# Patient Record
Sex: Male | Born: 1935 | Race: White | Hispanic: No | Smoking: Former smoker
Health system: Southern US, Community
[De-identification: ages and names within clinical notes are randomized; demographics above are authoritative.]

## PROBLEM LIST (undated history)

## (undated) DIAGNOSIS — K219 Gastro-esophageal reflux disease without esophagitis: Secondary | ICD-10-CM

## (undated) DIAGNOSIS — F1010 Alcohol abuse, uncomplicated: Secondary | ICD-10-CM

## (undated) DIAGNOSIS — I4891 Unspecified atrial fibrillation: Secondary | ICD-10-CM

## (undated) DIAGNOSIS — Z8719 Personal history of other diseases of the digestive system: Secondary | ICD-10-CM

## (undated) DIAGNOSIS — I10 Essential (primary) hypertension: Secondary | ICD-10-CM

## (undated) DIAGNOSIS — F0390 Unspecified dementia without behavioral disturbance: Secondary | ICD-10-CM

## (undated) DIAGNOSIS — I509 Heart failure, unspecified: Secondary | ICD-10-CM

## (undated) HISTORY — DX: Alcohol abuse, uncomplicated: F10.10

## (undated) HISTORY — DX: Essential (primary) hypertension: I10

## (undated) HISTORY — DX: Unspecified atrial fibrillation: I48.91

## (undated) HISTORY — PX: TONSILLECTOMY: SUR1361

## (undated) HISTORY — DX: Heart failure, unspecified: I50.9

## (undated) HISTORY — PX: EYE SURGERY: SHX253

---

## 2015-09-13 ENCOUNTER — Emergency Department (HOSPITAL_COMMUNITY): Payer: Medicare Other

## 2015-09-13 ENCOUNTER — Inpatient Hospital Stay (HOSPITAL_COMMUNITY)
Admission: EM | Admit: 2015-09-13 | Discharge: 2015-09-20 | DRG: 871 | Disposition: A | Discharge status: Skilled Nursing Facility | Payer: Medicare Other | Attending: Internal Medicine | Admitting: Internal Medicine

## 2015-09-13 ENCOUNTER — Encounter (HOSPITAL_COMMUNITY): Payer: Self-pay

## 2015-09-13 DIAGNOSIS — Z833 Family history of diabetes mellitus: Secondary | ICD-10-CM | POA: Diagnosis not present

## 2015-09-13 DIAGNOSIS — I480 Paroxysmal atrial fibrillation: Secondary | ICD-10-CM | POA: Diagnosis present

## 2015-09-13 DIAGNOSIS — F10239 Alcohol dependence with withdrawal, unspecified: Secondary | ICD-10-CM | POA: Insufficient documentation

## 2015-09-13 DIAGNOSIS — I420 Dilated cardiomyopathy: Secondary | ICD-10-CM | POA: Diagnosis not present

## 2015-09-13 DIAGNOSIS — I428 Other cardiomyopathies: Secondary | ICD-10-CM | POA: Diagnosis present

## 2015-09-13 DIAGNOSIS — I471 Supraventricular tachycardia: Secondary | ICD-10-CM | POA: Diagnosis present

## 2015-09-13 DIAGNOSIS — J9601 Acute respiratory failure with hypoxia: Secondary | ICD-10-CM | POA: Diagnosis present

## 2015-09-13 DIAGNOSIS — F1020 Alcohol dependence, uncomplicated: Secondary | ICD-10-CM | POA: Diagnosis present

## 2015-09-13 DIAGNOSIS — N183 Chronic kidney disease, stage 3 (moderate): Secondary | ICD-10-CM | POA: Diagnosis present

## 2015-09-13 DIAGNOSIS — G92 Toxic encephalopathy: Secondary | ICD-10-CM | POA: Diagnosis present

## 2015-09-13 DIAGNOSIS — I5031 Acute diastolic (congestive) heart failure: Secondary | ICD-10-CM | POA: Diagnosis not present

## 2015-09-13 DIAGNOSIS — I493 Ventricular premature depolarization: Secondary | ICD-10-CM | POA: Diagnosis present

## 2015-09-13 DIAGNOSIS — Z6824 Body mass index (BMI) 24.0-24.9, adult: Secondary | ICD-10-CM | POA: Diagnosis not present

## 2015-09-13 DIAGNOSIS — I214 Non-ST elevation (NSTEMI) myocardial infarction: Secondary | ICD-10-CM | POA: Diagnosis present

## 2015-09-13 DIAGNOSIS — Z808 Family history of malignant neoplasm of other organs or systems: Secondary | ICD-10-CM

## 2015-09-13 DIAGNOSIS — R6520 Severe sepsis without septic shock: Secondary | ICD-10-CM | POA: Diagnosis present

## 2015-09-13 DIAGNOSIS — G9340 Encephalopathy, unspecified: Secondary | ICD-10-CM | POA: Diagnosis not present

## 2015-09-13 DIAGNOSIS — E440 Moderate protein-calorie malnutrition: Secondary | ICD-10-CM | POA: Diagnosis present

## 2015-09-13 DIAGNOSIS — A419 Sepsis, unspecified organism: Secondary | ICD-10-CM | POA: Diagnosis present

## 2015-09-13 DIAGNOSIS — R778 Other specified abnormalities of plasma proteins: Secondary | ICD-10-CM

## 2015-09-13 DIAGNOSIS — R339 Retention of urine, unspecified: Secondary | ICD-10-CM | POA: Diagnosis present

## 2015-09-13 DIAGNOSIS — I5041 Acute combined systolic (congestive) and diastolic (congestive) heart failure: Secondary | ICD-10-CM | POA: Diagnosis present

## 2015-09-13 DIAGNOSIS — R7989 Other specified abnormal findings of blood chemistry: Secondary | ICD-10-CM

## 2015-09-13 DIAGNOSIS — E871 Hypo-osmolality and hyponatremia: Secondary | ICD-10-CM | POA: Diagnosis present

## 2015-09-13 DIAGNOSIS — E872 Acidosis: Secondary | ICD-10-CM | POA: Diagnosis present

## 2015-09-13 DIAGNOSIS — N179 Acute kidney failure, unspecified: Secondary | ICD-10-CM | POA: Diagnosis present

## 2015-09-13 DIAGNOSIS — F10231 Alcohol dependence with withdrawal delirium: Secondary | ICD-10-CM | POA: Diagnosis present

## 2015-09-13 DIAGNOSIS — J189 Pneumonia, unspecified organism: Secondary | ICD-10-CM | POA: Diagnosis present

## 2015-09-13 DIAGNOSIS — I4891 Unspecified atrial fibrillation: Secondary | ICD-10-CM

## 2015-09-13 DIAGNOSIS — F17210 Nicotine dependence, cigarettes, uncomplicated: Secondary | ICD-10-CM | POA: Diagnosis present

## 2015-09-13 DIAGNOSIS — J9602 Acute respiratory failure with hypercapnia: Secondary | ICD-10-CM | POA: Diagnosis present

## 2015-09-13 DIAGNOSIS — R531 Weakness: Secondary | ICD-10-CM | POA: Diagnosis present

## 2015-09-13 DIAGNOSIS — F10939 Alcohol use, unspecified with withdrawal, unspecified: Secondary | ICD-10-CM | POA: Insufficient documentation

## 2015-09-13 LAB — CBC
HEMATOCRIT: 50.3 % (ref 39.0–52.0)
HEMOGLOBIN: 16.7 g/dL (ref 13.0–17.0)
MCH: 30.6 pg (ref 26.0–34.0)
MCHC: 33.2 g/dL (ref 30.0–36.0)
MCV: 92.3 fL (ref 78.0–100.0)
Platelets: 210 10*3/uL (ref 150–400)
RBC: 5.45 MIL/uL (ref 4.22–5.81)
RDW: 14 % (ref 11.5–15.5)
WBC: 13 10*3/uL — AB (ref 4.0–10.5)

## 2015-09-13 LAB — CBC WITH DIFFERENTIAL/PLATELET
Basophils Absolute: 0 10*3/uL (ref 0.0–0.1)
Basophils Relative: 0 %
Eosinophils Absolute: 0 10*3/uL (ref 0.0–0.7)
Eosinophils Relative: 0 %
HCT: 49.6 % (ref 39.0–52.0)
Hemoglobin: 17.1 g/dL — ABNORMAL HIGH (ref 13.0–17.0)
Lymphocytes Relative: 6 %
Lymphs Abs: 0.8 10*3/uL (ref 0.7–4.0)
MCH: 31.4 pg (ref 26.0–34.0)
MCHC: 34.5 g/dL (ref 30.0–36.0)
MCV: 91 fL (ref 78.0–100.0)
Monocytes Absolute: 1.3 10*3/uL — ABNORMAL HIGH (ref 0.1–1.0)
Monocytes Relative: 10 %
Neutro Abs: 10.9 10*3/uL — ABNORMAL HIGH (ref 1.7–7.7)
Neutrophils Relative %: 84 %
Platelets: 237 10*3/uL (ref 150–400)
RBC: 5.45 MIL/uL (ref 4.22–5.81)
RDW: 13.9 % (ref 11.5–15.5)
WBC: 13.1 10*3/uL — ABNORMAL HIGH (ref 4.0–10.5)

## 2015-09-13 LAB — URINE MICROSCOPIC-ADD ON

## 2015-09-13 LAB — BASIC METABOLIC PANEL
Anion gap: 10 (ref 5–15)
BUN: 25 mg/dL — ABNORMAL HIGH (ref 6–20)
CO2: 26 mmol/L (ref 22–32)
Calcium: 9.2 mg/dL (ref 8.9–10.3)
Chloride: 95 mmol/L — ABNORMAL LOW (ref 101–111)
Creatinine, Ser: 1.3 mg/dL — ABNORMAL HIGH (ref 0.61–1.24)
GFR calc Af Amer: 59 mL/min — ABNORMAL LOW (ref >60)
GFR calc non Af Amer: 51 mL/min — ABNORMAL LOW (ref >60)
Glucose, Bld: 142 mg/dL — ABNORMAL HIGH (ref 65–99)
Potassium: 4.8 mmol/L (ref 3.5–5.1)
Sodium: 131 mmol/L — ABNORMAL LOW (ref 135–145)

## 2015-09-13 LAB — URINALYSIS, ROUTINE W REFLEX MICROSCOPIC
Glucose, UA: NEGATIVE mg/dL
Ketones, ur: 15 mg/dL — AB
Leukocytes, UA: NEGATIVE
Nitrite: NEGATIVE
Protein, ur: 100 mg/dL — AB
Specific Gravity, Urine: 1.021 (ref 1.005–1.030)
Urobilinogen, UA: 1 mg/dL (ref 0.0–1.0)
pH: 6 (ref 5.0–8.0)

## 2015-09-13 LAB — MRSA PCR SCREENING: MRSA by PCR: NEGATIVE

## 2015-09-13 LAB — LACTIC ACID, PLASMA
Lactic Acid, Venous: 1 mmol/L (ref 0.5–2.0)
Lactic Acid, Venous: 1.1 mmol/L (ref 0.5–2.0)

## 2015-09-13 LAB — BRAIN NATRIURETIC PEPTIDE: B Natriuretic Peptide: 1302.1 pg/mL — ABNORMAL HIGH (ref 0.0–100.0)

## 2015-09-13 LAB — CREATININE, SERUM
Creatinine, Ser: 1.13 mg/dL (ref 0.61–1.24)
GFR calc non Af Amer: >60 mL/min (ref >60)

## 2015-09-13 LAB — TROPONIN I
TROPONIN I: 0.16 ng/mL — AB (ref <0.031)
Troponin I: 0.12 ng/mL — ABNORMAL HIGH (ref <0.031)

## 2015-09-13 LAB — TSH: TSH: 0.964 u[IU]/mL (ref 0.350–4.500)

## 2015-09-13 LAB — MAGNESIUM: Magnesium: 2 mg/dL (ref 1.7–2.4)

## 2015-09-13 MED ORDER — CHLORHEXIDINE GLUCONATE 0.12 % MT SOLN
15.0000 mL | Freq: Two times a day (BID) | OROMUCOSAL | Status: DC
  Administered 2015-09-14 – 2015-09-15 (×4): 15 mL via OROMUCOSAL
  Filled 2015-09-13 (×2): qty 15

## 2015-09-13 MED ORDER — DEXTROSE 5 % IV SOLN
500.0000 mg | Freq: Once | INTRAVENOUS | Status: AC
  Administered 2015-09-13: 500 mg via INTRAVENOUS
  Filled 2015-09-13: qty 500

## 2015-09-13 MED ORDER — FUROSEMIDE 10 MG/ML IJ SOLN
40.0000 mg | Freq: Once | INTRAMUSCULAR | Status: AC
  Administered 2015-09-13: 40 mg via INTRAVENOUS
  Filled 2015-09-13: qty 4

## 2015-09-13 MED ORDER — DEXTROSE 5 % IV SOLN
500.0000 mg | INTRAVENOUS | Status: DC
  Administered 2015-09-14 – 2015-09-17 (×4): 500 mg via INTRAVENOUS
  Filled 2015-09-13 (×4): qty 500

## 2015-09-13 MED ORDER — ENOXAPARIN SODIUM 40 MG/0.4ML ~~LOC~~ SOLN
40.0000 mg | SUBCUTANEOUS | Status: DC
  Administered 2015-09-13: 40 mg via SUBCUTANEOUS
  Filled 2015-09-13: qty 0.4

## 2015-09-13 MED ORDER — ALBUTEROL SULFATE (2.5 MG/3ML) 0.083% IN NEBU: 2.5000 mg | INHALATION_SOLUTION | RESPIRATORY_TRACT | Status: DC | PRN

## 2015-09-13 MED ORDER — ASPIRIN 81 MG PO CHEW
324.0000 mg | CHEWABLE_TABLET | Freq: Once | ORAL | Status: AC
  Administered 2015-09-13: 324 mg via ORAL
  Filled 2015-09-13: qty 4

## 2015-09-13 MED ORDER — CLOTRIMAZOLE 1 % EX CREA
TOPICAL_CREAM | Freq: Two times a day (BID) | CUTANEOUS | Status: DC
  Administered 2015-09-13: 1 via TOPICAL
  Administered 2015-09-14 – 2015-09-15 (×3): via TOPICAL
  Administered 2015-09-16: 1 via TOPICAL
  Administered 2015-09-16 – 2015-09-20 (×7): via TOPICAL
  Filled 2015-09-13 (×2): qty 15

## 2015-09-13 MED ORDER — NOREPINEPHRINE BITARTRATE 1 MG/ML IV SOLN
5.0000 ug/min | INTRAVENOUS | Status: DC
  Filled 2015-09-13: qty 4

## 2015-09-13 MED ORDER — CETYLPYRIDINIUM CHLORIDE 0.05 % MT LIQD
7.0000 mL | Freq: Two times a day (BID) | OROMUCOSAL | Status: DC
  Administered 2015-09-14 – 2015-09-16 (×5): 7 mL via OROMUCOSAL

## 2015-09-13 MED ORDER — DEXTROSE 5 % IV SOLN
1.0000 g | Freq: Once | INTRAVENOUS | Status: AC
  Administered 2015-09-13: 1 g via INTRAVENOUS
  Filled 2015-09-13: qty 10

## 2015-09-13 MED ORDER — IPRATROPIUM-ALBUTEROL 0.5-2.5 (3) MG/3ML IN SOLN
3.0000 mL | Freq: Once | RESPIRATORY_TRACT | Status: AC
Start: 2015-09-13 — End: 2015-09-13
  Administered 2015-09-13: 3 mL via RESPIRATORY_TRACT
  Filled 2015-09-13: qty 3

## 2015-09-13 MED ORDER — SODIUM CHLORIDE 0.9 % IJ SOLN
3.0000 mL | Freq: Two times a day (BID) | INTRAMUSCULAR | Status: DC
  Administered 2015-09-13 – 2015-09-20 (×12): 3 mL via INTRAVENOUS

## 2015-09-13 MED ORDER — IPRATROPIUM-ALBUTEROL 0.5-2.5 (3) MG/3ML IN SOLN
3.0000 mL | Freq: Three times a day (TID) | RESPIRATORY_TRACT | Status: DC
  Administered 2015-09-14 – 2015-09-16 (×8): 3 mL via RESPIRATORY_TRACT
  Filled 2015-09-13 (×10): qty 3

## 2015-09-13 MED ORDER — SODIUM CHLORIDE 0.9 % IV BOLUS (SEPSIS): 1000.0000 mL | Freq: Once | INTRAVENOUS | Status: DC

## 2015-09-13 MED ORDER — DEXTROSE 5 % IV SOLN
1.0000 g | INTRAVENOUS | Status: DC
  Administered 2015-09-14 – 2015-09-18 (×5): 1 g via INTRAVENOUS
  Filled 2015-09-13 (×5): qty 10

## 2015-09-13 NOTE — ED Provider Notes (Signed)
CSN: [id number]     Arrival date & time 09/13/15  1612 History   First MD Initiated Contact with Patient 09/13/15 1615     Chief Complaint  Patient presents with  . Aphasia  . Weakness     (Consider location/radiation/quality/duration/timing/severity/associated sxs/prior Treatment) HPI   79yM brought in by neighbor for evaluation of decreased mental status. Pt can tell me he doesn't feel well but vague on specifics. Neighbor knocked on door last night to check on him but pt didn't answer. Neighbor went inside and pt said he was too tired to trying getting out of bed. Neighbor called him earlier today and speech seemed slurred and convinced pt to be evaluated.  Pt lives alone. Neighbor last saw pt his normal self a couple days ago. Pt unable to tell me exactly when began feeling poorly. Endorses SOB. Denies any pain anywhere. No fever or chills. No cough. Denies any significant PMHx but does not have routine medical care. Smoker. Denies any trauma or ingestion.   History reviewed. No pertinent past medical history. Past Surgical History  Procedure Laterality Date  . Tonsillectomy     History reviewed. No pertinent family history. Social History  Substance Use Topics  . Smoking status: Current Every Day Smoker  . Smokeless tobacco: None  . Alcohol Use: Yes     Comment: daily    Review of Systems  Level 5 caveat because of encephalopathy.   Allergies  Review of patient's allergies indicates no known allergies.  Home Medications   Prior to Admission medications   Medication Sig Start Date End Date Taking? Authorizing Provider  Ascorbic Acid (VITAMIN C PO) Take by mouth daily.   Yes Historical Provider, MD  Cholecalciferol (VITAMIN D PO) Take by mouth daily.   Yes Historical Provider, MD  OVER THE COUNTER MEDICATION daily. "Vitamin B-12."   Yes Historical Provider, MD  VITAMIN E PO Take by mouth daily.   Yes Historical Provider, MD   BP 154/95 mmHg  Pulse 115  Temp(Src) 98.6  F (37 C) (Oral)  Resp 26  SpO2 91% Physical Exam  Constitutional: He appears well-developed and well-nourished. No distress.  HENT:  Head: Normocephalic and atraumatic.  Eyes: Conjunctivae are normal. Right eye exhibits no discharge. Left eye exhibits no discharge.  Neck: Neck supple.  Cardiovascular: Regular rhythm and normal heart sounds.  Exam reveals no gallop and no friction rub.   No murmur heard. mild tachycardia. irreg irreg.   Pulmonary/Chest: Effort normal and breath sounds normal. No respiratory distress.  Abdominal: Soft. He exhibits no distension. There is no tenderness.  Musculoskeletal: He exhibits edema. He exhibits no tenderness.  Neurological: He is alert.  Laying in bed with eyes open. Speech somewhat slow but understandable. Follows commands. Disoriented to day/month. [employer]. CN 2-12 intact. Mild global weakness. No focal motor deficit.   Skin: Skin is warm and dry.  Nursing note and vitals reviewed.   ED Course  Procedures (including critical care time) Labs Review Labs Reviewed  CBC WITH DIFFERENTIAL/PLATELET - Abnormal; Notable for the following:    WBC 13.1 (*)    Hemoglobin 17.1 (*)    Neutro Abs 10.9 (*)    Monocytes Absolute 1.3 (*)    All other components within normal limits  BASIC METABOLIC PANEL - Abnormal; Notable for the following:    Sodium 131 (*)    Chloride 95 (*)    Glucose, Bld 142 (*)    BUN 25 (*)    Creatinine,  Ser 1.30 (*)    GFR calc non Af Amer 51 (*)    GFR calc Af Amer 59 (*)    All other components within normal limits  TROPONIN I - Abnormal; Notable for the following:    Troponin I 0.12 (*)    All other components within normal limits  BRAIN NATRIURETIC PEPTIDE - Abnormal; Notable for the following:    B Natriuretic Peptide 1302.1 (*)    All other components within normal limits  BLOOD GAS, ARTERIAL - Abnormal; Notable for the following:    pH, Arterial 7.290 (*)    pCO2 arterial 60.7 (*)     Bicarbonate 28.3 (*)    All other components within normal limits  TSH  MAGNESIUM  URINALYSIS, ROUTINE W REFLEX MICROSCOPIC (NOT AT [facility])    Imaging Review Ct Head Wo Contrast  09/13/2015   CLINICAL DATA:  Slurred speech.  EXAM: CT HEAD WITHOUT CONTRAST  TECHNIQUE: Contiguous axial images were obtained from the base of the skull through the vertex without intravenous contrast.  COMPARISON:  None.  FINDINGS: The brainstem, cerebellum, cerebral peduncles, thalamus, basal ganglia, basilar cisterns, and ventricular system appear within normal limits. Periventricular white matter and corona radiata hypodensities favor chronic ischemic microvascular white matter disease. No intracranial hemorrhage, mass lesion, or acute CVA.  Chronic ethmoid and left maxillary sinusitis.  IMPRESSION: 1. No acute intracranial findings. 2. Periventricular white matter and corona radiata hypodensities favor chronic ischemic microvascular white matter disease. 3. Chronic ethmoid and left maxillary sinusitis.   Electronically Signed   By: Gaylyn Rong M.D.   On: 09/13/2015 17:12   Dg Chest Portable 1 View  09/13/2015   CLINICAL DATA:  Dyspnea.  Fatigue.  Altered speech.  EXAM: PORTABLE CHEST 1 VIEW  COMPARISON:  None.  FINDINGS: Midline trachea. Normal heart size for level of inspiration. Probable small left pleural effusion. No pneumothorax. Patchy left base opacities, obscuring the left hemidiaphragm.  IMPRESSION: Suspicion of left lower lobe airspace disease/ pneumonia with small left pleural effusion. Consider PA and lateral radiographs for further evaluation.   Electronically Signed   By: Jeronimo Greaves M.D.   On: 09/13/2015 17:21   I have personally reviewed and evaluated these images and lab results as part of my medical decision-making.   EKG Interpretation   Date/Time:  Monday September 13 2015 16:32:07 EDT Ventricular Rate:  111 PR Interval:    QRS Duration: 161 QT Interval:  373 QTC Calculation: 507 R  Axis:   107 Text Interpretation:  Atrial fibrillation Anteroseptal infarct, old Repol  abnrm suggests ischemia, diffuse leads No old tracing to compare Confirmed  by Maitland Muhlbauer  MD, Williams Dietrick ([id number]) on 09/13/2015 6:40:28 PM      MDM   Final diagnoses:  Encephalopathy acute  CAP (community acquired pneumonia)  Atrial fibrillation, unspecified  Elevated troponin    79yM brought in by neighbor because of decreased mental status.   Seems more like generalized weakness/drowsiness than acute neurological event. CT head w/o acute abnormality. Probably multifactorial. Mental status improved when placed on supplemental o2.  Wheezing on exam. Long standing smoking hx. May have some degree of underlying lung disease. Bicarb with pCO2 in 60s would support this. Wheezing improving with nebs.  CXR w/ LLL infiltrate. Abx for possible CAP. Noted to be in afib. Rate around 100. Mild hypertension. Rate controlling/BP meds deffered at this time. Ischemic changes on EKG and mildly elevated troponin. ASA given. He has continually denied CP through ED stay. Heparin deferred. Discussed  with cards. Suspect may be more demand ischemia. Will see in consultation. Will discuss with medicine.     Raeford Razor, MD 09/22/15 [phone]

## 2015-09-13 NOTE — ED Notes (Signed)
Unable to collect labs at this time MD in room with patient. 

## 2015-09-13 NOTE — ED Notes (Signed)
Per friend, came to pt home last night and unable to get him out of bed.  Called him this morning and speech was not clear.  Pt is fatigued and weak.  Speech is slightly off.  Equal grips and strength.  No unilateral changes noted.  Dr. Juleen China assessed pt in lobby

## 2015-09-13 NOTE — ED Notes (Signed)
Patient gave permission for staff to discuss care with his ex-wife (number in chart). She is the only one who lives locally and came to check on him on behalf of the children. Son is said to be the POA but lives in [place].

## 2015-09-13 NOTE — Progress Notes (Deleted)
eLink Physician-Brief Progress Note Patient Name: Mark Mullen DOB: 1935-08-14 MRN: [id number]   Date of Service  09/13/2015  HPI/Events of Note  Septic shock  And lactic acid high and not f/u   eICU Interventions  Start septic shock protocol  Full pccm note to follow      Intervention Category Major Interventions: Shock - evaluation and management;Sepsis - evaluation and management  Shan Levans 09/13/2015, 10:07 PM

## 2015-09-13 NOTE — ED Notes (Signed)
Patient transported to X-ray 

## 2015-09-13 NOTE — Progress Notes (Signed)
Elink entered in error.  i took septic shock orders out  My prior note is in error.  Mark Mullen

## 2015-09-13 NOTE — ED Notes (Signed)
Pt had periods of apnea where saturations with drop to 81% and pt was hard to arouse. RN made aware, and pt moved to Resus room per MD order

## 2015-09-13 NOTE — ED Notes (Signed)
Unable to collect labs at this time patient is not in room. 

## 2015-09-13 NOTE — H&P (Signed)
History and Physical  Mark Mullen  ZOX:[id number]  DOB: [birth year]  DOA: 09/13/2015  Referring physician: Raeford Razor, MD PCP: None  Chief Complaint: Found at home, sluggish and hypoxic.  HPI: Mark Mullen is a 80 y.o. male with no known past medical history who presents with 1 week malaise, weakness.  History is collected from the patient's ex-wife, Mark Mullen (see demographics) as the patient is unable to provide reliable history.  Evidently he is somewhat reclusive, but in the last week family have been unable to reach him by phone, and yesterday sent a neighbor to check on him.  He was appeared tired and sluggish, but sent the neighbor away.  Today, there was enough concern that EMS were called and found the patient confused, lethargic and hypoxic.    In the ED, the patient was in atrial fibrillation with adequate BP.  He appeared fluid overloaded and had a LLL infiltrate.  He was acidotic to 7.29 with hypercarbia and required >6L O2 to maintain adequate oxygen saturation.  He was given bronchodilators, furosemide, and started on CTX and azithromycin for CAP and sepsis and was admitted to [facility] in the step-down unit.     Review of Systems:  Patient seen 8:25 PM on 09/13/2015. Pt denies all review of systems.  He denies dyspnea, but is clearly working hard to breathe.  He denies weakness or fatigue, but cannot sit up.   History reviewed. No pertinent past medical history. The above past medical history was reviewed.  His wife reports he has macular degeneration and has never been diagnosed with heart disease, stroke, lung disease, kidney disease, hypertension or diabetes, however, he has had no medical care for decades.  Past Surgical History  Procedure Laterality Date  . Tonsillectomy    The above surgical history was reviewed.  Social History:  reports that he has been smoking.  He does not have any smokeless tobacco history on file. He reports that he drinks alcohol. He reports  that he does not use illicit drugs. Patient lives alone in [place].  His HCPOA is his son, Mark Mullen.  [name] is present in [place].  She reports that he is gay, that he is a current every day smoker, and that he drinks alcohol regularly.  No Known Allergies  Family History  Problem Relation Age of Onset  . Brain cancer Mother   . Aneurysm Father   . Diabetes Sister   . Diabetes Brother       Prior to Admission medications   Medication Sig Start Date End Date Taking? Authorizing Provider  Ascorbic Acid (VITAMIN C PO) Take by mouth daily.   Yes Historical Provider, MD  Cholecalciferol (VITAMIN D PO) Take by mouth daily.   Yes Historical Provider, MD  OVER THE COUNTER MEDICATION daily. "Vitamin B-12."   Yes Historical Provider, MD  VITAMIN E PO Take by mouth daily.   Yes Historical Provider, MD    Physical Exam: BP 139/98 mmHg  Pulse 85  Temp(Src) 98.6 F (37 C) (Oral)  Resp 22  SpO2 99% General: Elderly, adult male, lethargic, weak, and confused.  On oxymask. HEENT: Head normal.  Corneas clear, conjunctivae and sclerae normal without injection or icterus, lids and lashes normal, scant purulent eye discharge on left.  PERRL.  Nose normal.  OP tacky, without plaques or ulcers.  No airway deformities.  Lymph: No cervical or axillary lymphadenopathy. Skin: Warm.  No jaundice.  No suspicious rashes or lesions.  Numerous SKs all over.  Cardiac: Tachycardic irregularly irregular, no murmurs appreciated.  Capillary refill is less than 2 seconds.  JVP appears normal.  2+ LE edema to knees.  Radial and DP pulses 2+ and symmetric. Respiratory: Tachypneic.  Very coarse rales diffusely, these can even be felt on the chest. Abdomen: BS present.  No TTP or rebound all quadrants.  Soft and without distension or guarding.  No striae, scars, dilated veins, rashes, or lesions.   Neuro: Responds to questions slowly.  Poor eye contact.  Oriented to state and city, but not hospital or  year.  Speech is sluggish.    Muscle bulk somewhat diminished.  Globally weak with 3+/5 grip strength and elbow and wrist flexion/extension, symmetrically and  3+/5 hip flexion symmetrically.  Poorly able to follow commands.               Labs on Admission:  The metabolic panel is notable for hyponatremia, normal potassium, normal bicarbonate, creatinine 1.3 mg/dL with no baseline. Magnesium normal. ABG shows pH 7.29, PCO2 60, bicarbonate 28. The BNP is 13,000 pg/mL. The denies 0.12 ng/mL. TSH is normal. The complete blood count is notable for leukocytosis to 13.1K/ul, normal platelets.     Radiological Exams on Admission: Ct Head Wo Contrast 09/13/2015    NAICD  Dg Chest Portable 1 View Personally reviewed: 09/13/2015 Congested, possible left effusion and ?LLL opacity.   EKG: Independently reviewed. Atrial fibrillation with ST depressions diffusely.    Assessment/Plan Present on Admission:  . Encephalopathy acute . Severe sepsis with acute organ dysfunction . AKI (acute kidney injury) . Atrial fibrillation . NSTEMI (non-ST elevated myocardial infarction) . CAP (community acquired pneumonia) . Severe sepsis   1. Severe sepsis from CAP:  Family report the patient complained of a cold and not feeling well about a week ago, and his chest x-ray and white blood cell count suggests that he does have a community-acquired pneumonia. -Trend lactic acid -Cautious fluid resuscitation, this will have to be balanced with his hypoxia and what appears to be fluid overload on clinical exam. -Follow blood cultures -Consult RT and bronchodilators -Strep pneumo urinary antigen -Ceftriaxone 1 g daily -Azithromycin 250 mg daily  2. Acute encephalopathy: Suspect that this is better explained by #1 above rather than stroke.  3. Acute kidney injury: The patient's baseline creatinine is not known. He is encephalopathic and has evidence of heart failure and I suspect that his abdomen  is a function of acute kidney failure and chronic kidney disease. -Hold nephrotoxins -Trend BMP  4. New onset atrial fibrillation: This was discussed with cardiology by the ED. His heart rate is controlled at the moment and hopefully this will resolve as the patient's underlying sepsis is treated and his volume status is optimized.  5. NSTEMI: Suspect dyspnea and ischemia in the setting of atrial fibrillation. -Trend troponin  6. Hyponatremia: This appears to be a hypervolemic hyponatremia. -Trend BMP  7. Hypercarbia: I suspect some of the patient's acidosis is related to this hypercarbia and some is related to lactic acidosis. -BiPAP -Repeat ABG at midnight     DVT PPx: Lovenox Diet: Regular Code Status: Full Family Communication: The patient's care was discussed with his ex-wife Darel Hong.  His diagnosis of pneumonia and severe sepsis with organ dysfunction was discussed, his code status was clarified with the patient who did seem to both of [place] to desire invasive ventilation if needed.  All questions were answered.  I called the patient's son Josephanthony Tindel, who did not answer, but await  a call back.   Disposition Plan:  The appropriate admission status for this patient is INPATIENT. Inpatient status is judged to be reasonable and necessary in order to provide the required intensity of service to ensure the patient's safety. The patient's presenting symptoms, physical exam findings, and initial radiographic and laboratory data in the context of their chronic comorbidities is felt to place them at high risk for further clinical deterioration. Furthermore, it is not anticipated that the patient will be medically stable for discharge from the hospital within 2 midnights of admission. The following factors support the admission status of inpatient.   A. The patient's presenting symptoms include encephalopathy from sepsis, lethargy. B. The worrisome physical exam findings include tachycardia,  tachypnea, rales, weakness. C. The initial radiographic and laboratory data are worrisome because of acidosis, renal failure, leukocytosis, chest infiltrate. D. The chronic co-morbidities include smoking. E. Patient requires inpatient status due to high intensity of service, high risk for further deterioration and high frequency of surveillance required. F. I certify that at the point of admission it is my clinical judgment that the patient will require inpatient hospital care spanning beyond 2 midnights from the point of admission.     Alberteen Sam [facility] Pager [phone]

## 2015-09-13 NOTE — ED Notes (Signed)
MD with patient

## 2015-09-13 NOTE — ED Notes (Signed)
Respiratory called to collect ABG 

## 2015-09-13 NOTE — Progress Notes (Signed)
Patient noted as not having a pcp or insurance.  EDCM went to speak to patient at bedside, however patient having portable xray at this time.  No family present.  EDRN reports patient is confused.

## 2015-09-14 ENCOUNTER — Inpatient Hospital Stay (HOSPITAL_COMMUNITY): Payer: Medicare Other

## 2015-09-14 ENCOUNTER — Encounter (HOSPITAL_COMMUNITY): Payer: Self-pay | Admitting: Cardiology

## 2015-09-14 DIAGNOSIS — N179 Acute kidney failure, unspecified: Secondary | ICD-10-CM

## 2015-09-14 DIAGNOSIS — A419 Sepsis, unspecified organism: Principal | ICD-10-CM

## 2015-09-14 DIAGNOSIS — I4891 Unspecified atrial fibrillation: Secondary | ICD-10-CM

## 2015-09-14 DIAGNOSIS — E440 Moderate protein-calorie malnutrition: Secondary | ICD-10-CM

## 2015-09-14 DIAGNOSIS — I480 Paroxysmal atrial fibrillation: Secondary | ICD-10-CM

## 2015-09-14 DIAGNOSIS — I420 Dilated cardiomyopathy: Secondary | ICD-10-CM

## 2015-09-14 DIAGNOSIS — R6520 Severe sepsis without septic shock: Secondary | ICD-10-CM

## 2015-09-14 LAB — BLOOD GAS, ARTERIAL
ACID-BASE EXCESS: 2.2 mmol/L — AB (ref 0.0–2.0)
Acid-Base Excess: 0.2 mmol/L (ref 0.0–2.0)
Acid-Base Excess: 3.3 mmol/L — ABNORMAL HIGH (ref 0.0–2.0)
Bicarbonate: 28.1 mEq/L — ABNORMAL HIGH (ref 20.0–24.0)
Bicarbonate: 28.3 mEq/L — ABNORMAL HIGH (ref 20.0–24.0)
Bicarbonate: 29.9 mEq/L — ABNORMAL HIGH (ref 20.0–24.0)
DRAWN BY: 257701
DRAWN BY: 422461
Delivery systems: POSITIVE
Delivery systems: POSITIVE
Drawn by: 307971
EXPIRATORY PAP: 6
Expiratory PAP: 6
FIO2: 0.3
FIO2: 0.6
Inspiratory PAP: 14
Inspiratory PAP: 20
MODE: POSITIVE
Mode: POSITIVE
O2 Content: 3.5 L/min
O2 SAT: 96.6 %
O2 SAT: 99 %
O2 Saturation: 96 %
PATIENT TEMPERATURE: 98.6
PATIENT TEMPERATURE: 98.2
PCO2 ART: 45.1 mmHg — AB (ref 35.0–45.0)
PCO2 ART: 59.6 mmHg — AB (ref 35.0–45.0)
PH ART: 7.319 — AB (ref 7.350–7.450)
PO2 ART: 89.3 mmHg (ref 80.0–100.0)
PO2 ART: 205 mmHg — AB (ref 80.0–100.0)
Patient temperature: 98.6
RATE: 10 resp/min
TCO2: 24.1 mmol/L (ref 0–100)
TCO2: 24.6 mmol/L (ref 0–100)
TCO2: 26 mmol/L (ref 0–100)
pCO2 arterial: 60.7 mmHg (ref 35.0–45.0)
pH, Arterial: 7.29 — ABNORMAL LOW (ref 7.350–7.450)
pH, Arterial: 7.411 (ref 7.350–7.450)
pO2, Arterial: 96.8 mmHg (ref 80.0–100.0)

## 2015-09-14 LAB — BASIC METABOLIC PANEL
Anion gap: 8 (ref 5–15)
BUN: 24 mg/dL — AB (ref 6–20)
CHLORIDE: 94 mmol/L — AB (ref 101–111)
CO2: 30 mmol/L (ref 22–32)
CREATININE: 1.27 mg/dL — AB (ref 0.61–1.24)
Calcium: 8.6 mg/dL — ABNORMAL LOW (ref 8.9–10.3)
GFR calc Af Amer: >60 mL/min (ref >60)
GFR calc non Af Amer: 53 mL/min — ABNORMAL LOW (ref >60)
GLUCOSE: 113 mg/dL — AB (ref 65–99)
Potassium: 4.2 mmol/L (ref 3.5–5.1)
SODIUM: 132 mmol/L — AB (ref 135–145)

## 2015-09-14 LAB — TROPONIN I
TROPONIN I: 0.16 ng/mL — AB (ref <0.031)
Troponin I: 0.14 ng/mL — ABNORMAL HIGH (ref <0.031)

## 2015-09-14 LAB — STREP PNEUMONIAE URINARY ANTIGEN: STREP PNEUMO URINARY ANTIGEN: NEGATIVE

## 2015-09-14 LAB — HEPARIN LEVEL (UNFRACTIONATED): HEPARIN UNFRACTIONATED: 0.7 [IU]/mL (ref 0.30–0.70)

## 2015-09-14 LAB — HIV ANTIBODY (ROUTINE TESTING W REFLEX): HIV Screen 4th Generation wRfx: NONREACTIVE

## 2015-09-14 MED ORDER — LORAZEPAM 2 MG/ML IJ SOLN
2.0000 mg | INTRAMUSCULAR | Status: DC | PRN
  Administered 2015-09-14 – 2015-09-15 (×2): 2 mg via INTRAVENOUS
  Administered 2015-09-15: 1 mg via INTRAVENOUS
  Filled 2015-09-14 (×3): qty 1

## 2015-09-14 MED ORDER — HEPARIN BOLUS VIA INFUSION
3500.0000 [IU] | Freq: Once | INTRAVENOUS | Status: AC
  Administered 2015-09-14: 3500 [IU] via INTRAVENOUS
  Filled 2015-09-14: qty 3500

## 2015-09-14 MED ORDER — ASPIRIN 81 MG PO CHEW
81.0000 mg | CHEWABLE_TABLET | Freq: Once | ORAL | Status: AC
  Administered 2015-09-14: 81 mg via ORAL
  Filled 2015-09-14: qty 1

## 2015-09-14 MED ORDER — HEPARIN (PORCINE) IN NACL 100-0.45 UNIT/ML-% IJ SOLN
1000.0000 [IU]/h | INTRAMUSCULAR | Status: DC
  Administered 2015-09-14: 1000 [IU]/h via INTRAVENOUS
  Filled 2015-09-14: qty 250

## 2015-09-14 MED ORDER — FUROSEMIDE 10 MG/ML IJ SOLN
20.0000 mg | Freq: Once | INTRAMUSCULAR | Status: AC
  Administered 2015-09-14: 20 mg via INTRAVENOUS
  Filled 2015-09-14: qty 2

## 2015-09-14 MED ORDER — HEPARIN (PORCINE) IN NACL 100-0.45 UNIT/ML-% IJ SOLN
950.0000 [IU]/h | INTRAMUSCULAR | Status: DC
  Administered 2015-09-15: 950 [IU]/h via INTRAVENOUS
  Filled 2015-09-14 (×3): qty 250

## 2015-09-14 NOTE — Consult Note (Signed)
Reason for Consult: a fib new    Referring Physician: Dr. Loleta Books  PCP:  No primary care provider on file.  Primary Cardiologist:NEW  Mark Mullen is an 80 y.o. male.    Chief Complaint: brought by EMS to hospital 09/12/14 for sluggish, hypoxic at home, had been weak for 1 week.   Son is POA and lives in [place].  [name] provided information last pm.  HPI: 81 year old was admitted by [facility] for hypoxia, sepsis from CAP, acute encephalopathy AKI, new AFib-rate control and elevated troponin though more flat at 0.12,0.16,0.16.  BNP was elevated at 1302.  WBC 13.1.   He was placed on BiPAP.   EKG a fib with rates up to 111, with t Wave inversions in II,III,AVF and V4-6.  No old EKG to compare.   Today no chest pain.  Memory prior to hospitalization poor. Still with BiPAP, Echo being done.  No prior cardiac hx, no CVA.  History reviewed. No pertinent past medical history.  No hx of MI, CAD or CVA   Past Surgical History  Procedure Laterality Date  . Tonsillectomy      Family History  Problem Relation Age of Onset  . Brain cancer Mother   . Aneurysm Father   . Diabetes Sister   . Diabetes Brother    Social History:  reports that he has been smoking.  He does not have any smokeless tobacco history on file. He reports that he drinks alcohol. He reports that he does not use illicit drugs.  OUTPATIENT MEDICATIONS: No current facility-administered medications on file prior to encounter.   No current outpatient prescriptions on file prior to encounter.   CURRENT MEDICATIONS: Scheduled Meds: . antiseptic oral rinse  7 mL Mouth Rinse q12n4p  . azithromycin  500 mg Intravenous Q24H  . cefTRIAXone (ROCEPHIN)  IV  1 g Intravenous Q24H  . chlorhexidine  15 mL Mouth Rinse BID  . clotrimazole   Topical BID  . enoxaparin (LOVENOX) injection  40 mg Subcutaneous Q24H  . ipratropium-albuterol  3 mL Nebulization TID  . sodium chloride  3 mL Intravenous Q12H   Continuous  Infusions:  PRN Meds:.albuterol   Results for orders placed or performed during the hospital encounter of 09/13/15 (from the past 48 hour(s))  CBC with Differential     Status: Abnormal   Collection Time: 09/13/15  4:50 PM  Result Value Ref Range   WBC 13.1 (H) 4.0 - 10.5 K/uL   RBC 5.45 4.22 - 5.81 MIL/uL   Hemoglobin 17.1 (H) 13.0 - 17.0 g/dL   HCT 49.6 39.0 - 52.0 %   MCV 91.0 78.0 - 100.0 fL   MCH 31.4 26.0 - 34.0 pg   MCHC 34.5 30.0 - 36.0 g/dL   RDW 13.9 11.5 - 15.5 %   Platelets 237 150 - 400 K/uL   Neutrophils Relative % 84 %   Neutro Abs 10.9 (H) 1.7 - 7.7 K/uL   Lymphocytes Relative 6 %   Lymphs Abs 0.8 0.7 - 4.0 K/uL   Monocytes Relative 10 %   Monocytes Absolute 1.3 (H) 0.1 - 1.0 K/uL   Eosinophils Relative 0 %   Eosinophils Absolute 0.0 0.0 - 0.7 K/uL   Basophils Relative 0 %   Basophils Absolute 0.0 0.0 - 0.1 K/uL  Basic metabolic panel     Status: Abnormal   Collection Time: 09/13/15  4:50 PM  Result Value Ref Range   Sodium 131 (  L) 135 - 145 mmol/L   Potassium 4.8 3.5 - 5.1 mmol/L   Chloride 95 (L) 101 - 111 mmol/L   CO2 26 22 - 32 mmol/L   Glucose, Bld 142 (H) 65 - 99 mg/dL   BUN 25 (H) 6 - 20 mg/dL   Creatinine, Ser 1.30 (H) 0.61 - 1.24 mg/dL   Calcium 9.2 8.9 - 10.3 mg/dL   GFR calc non Af Amer 51 (L) >60 mL/min   GFR calc Af Amer 59 (L) >60 mL/min    Comment: (NOTE) The eGFR has been calculated using the CKD EPI equation. This calculation has not been validated in all clinical situations. eGFR's persistently <60 mL/min signify possible Chronic Kidney Disease.    Anion gap 10 5 - 15  Troponin I     Status: Abnormal   Collection Time: 09/13/15  4:50 PM  Result Value Ref Range   Troponin I 0.12 (H) <0.031 ng/mL    Comment:        PERSISTENTLY INCREASED TROPONIN VALUES IN THE RANGE OF 0.04-0.49 ng/mL CAN BE SEEN IN:       -UNSTABLE ANGINA       -CONGESTIVE HEART FAILURE       -MYOCARDITIS       -CHEST TRAUMA       -ARRYHTHMIAS       -LATE  PRESENTING MYOCARDIAL INFARCTION       -COPD   CLINICAL FOLLOW-UP RECOMMENDED.   Brain natriuretic peptide     Status: Abnormal   Collection Time: 09/13/15  4:50 PM  Result Value Ref Range   B Natriuretic Peptide 1302.1 (H) 0.0 - 100.0 pg/mL  TSH     Status: None   Collection Time: 09/13/15  4:50 PM  Result Value Ref Range   TSH 0.964 0.350 - 4.500 uIU/mL  Magnesium     Status: None   Collection Time: 09/13/15  4:50 PM  Result Value Ref Range   Magnesium 2.0 1.7 - 2.4 mg/dL  Blood gas, arterial ([facility] & [facility] ONLY)     Status: Abnormal   Collection Time: 09/13/15  6:35 PM  Result Value Ref Range   O2 Content 3.5 L/min   Delivery systems NASAL CANNULA    pH, Arterial 7.290 (L) 7.350 - 7.450   pCO2 arterial 60.7 (HH) 35.0 - 45.0 mmHg    Comment: CRITICAL RESULT CALLED TO, READ BACK BY AND VERIFIED WITH: DR Edwinna Areola AT 1840 BY SHERRY REEKES,RRT,RCP, ON 09/13/2015    pO2, Arterial 96.8 80.0 - 100.0 mmHg   Bicarbonate 28.3 (H) 20.0 - 24.0 mEq/L   TCO2 24.6 0 - 100 mmol/L   Acid-Base Excess 0.2 0.0 - 2.0 mmol/L   O2 Saturation 96.0 %   Patient temperature 98.6    Collection site LEFT BRACHIAL    Drawn by [id number]    Sample type ARTERIAL DRAW   Urinalysis, Routine w reflex microscopic (not at [facility])     Status: Abnormal   Collection Time: 09/13/15  7:07 PM  Result Value Ref Range   Color, Urine AMBER (A) YELLOW    Comment: BIOCHEMICALS MAY BE AFFECTED BY COLOR   APPearance CLEAR CLEAR   Specific Gravity, Urine 1.021 1.005 - 1.030   pH 6.0 5.0 - 8.0   Glucose, UA NEGATIVE NEGATIVE mg/dL   Hgb urine dipstick TRACE (A) NEGATIVE   Bilirubin Urine SMALL (A) NEGATIVE   Ketones, ur 15 (A) NEGATIVE mg/dL   Protein, ur 100 (A) NEGATIVE mg/dL   Urobilinogen, UA 1.0  0.0 - 1.0 mg/dL   Nitrite NEGATIVE NEGATIVE   Leukocytes, UA NEGATIVE NEGATIVE  Urine microscopic-add on     Status: None   Collection Time: 09/13/15  7:07 PM  Result Value Ref Range   RBC / HPF 0-2 <3 RBC/hpf  Lactic acid,  plasma     Status: None   Collection Time: 09/13/15  8:12 PM  Result Value Ref Range   Lactic Acid, Venous 1.0 0.5 - 2.0 mmol/L  MRSA PCR Screening     Status: None   Collection Time: 09/13/15  9:08 PM  Result Value Ref Range   MRSA by PCR NEGATIVE NEGATIVE    Comment:        The GeneXpert MRSA Assay (FDA approved for NASAL specimens only), is one component of a comprehensive MRSA colonization surveillance program. It is not intended to diagnose MRSA infection nor to guide or monitor treatment for MRSA infections.   CBC     Status: Abnormal   Collection Time: 09/13/15  9:26 PM  Result Value Ref Range   WBC 13.0 (H) 4.0 - 10.5 K/uL   RBC 5.45 4.22 - 5.81 MIL/uL   Hemoglobin 16.7 13.0 - 17.0 g/dL   HCT 50.3 39.0 - 52.0 %   MCV 92.3 78.0 - 100.0 fL   MCH 30.6 26.0 - 34.0 pg   MCHC 33.2 30.0 - 36.0 g/dL   RDW 14.0 11.5 - 15.5 %   Platelets 210 150 - 400 K/uL  Creatinine, serum     Status: None   Collection Time: 09/13/15  9:26 PM  Result Value Ref Range   Creatinine, Ser 1.13 0.61 - 1.24 mg/dL   GFR calc non Af Amer >60 >60 mL/min   GFR calc Af Amer >60 >60 mL/min    Comment: (NOTE) The eGFR has been calculated using the CKD EPI equation. This calculation has not been validated in all clinical situations. eGFR's persistently <60 mL/min signify possible Chronic Kidney Disease.   Troponin I (q 6hr x 3)     Status: Abnormal   Collection Time: 09/13/15  9:26 PM  Result Value Ref Range   Troponin I 0.16 (H) <0.031 ng/mL    Comment:        PERSISTENTLY INCREASED TROPONIN VALUES IN THE RANGE OF 0.04-0.49 ng/mL CAN BE SEEN IN:       -UNSTABLE ANGINA       -CONGESTIVE HEART FAILURE       -MYOCARDITIS       -CHEST TRAUMA       -ARRYHTHMIAS       -LATE PRESENTING MYOCARDIAL INFARCTION       -COPD   CLINICAL FOLLOW-UP RECOMMENDED.   Lactic acid, plasma     Status: None   Collection Time: 09/13/15  9:26 PM  Result Value Ref Range   Lactic Acid, Venous 1.1 0.5 - 2.0  mmol/L  Strep pneumoniae urinary antigen     Status: None   Collection Time: 09/14/15 12:00 AM  Result Value Ref Range   Strep Pneumo Urinary Antigen NEGATIVE NEGATIVE    Comment:        Infection due to S. pneumoniae cannot be absolutely ruled out since the antigen present may be below the detection limit of the test.   Blood gas, arterial     Status: Abnormal   Collection Time: 09/14/15 12:13 AM  Result Value Ref Range   FIO2 0.60    Delivery systems BILEVEL POSITIVE AIRWAY PRESSURE    Mode BILEVEL POSITIVE  AIRWAY PRESSURE    LHR 10 resp/min    Comment: BACK UP RATE   Inspiratory PAP 20.0    Expiratory PAP 6.0    pH, Arterial 7.319 (L) 7.350 - 7.450   pCO2 arterial 59.6 (HH) 35.0 - 45.0 mmHg    Comment: CRITICAL RESULT CALLED TO, READ BACK BY AND VERIFIED WITH: Carson Myrtle, RN AT 2706 ON 09/14/15 BY D. JONES, RRT, RCP    pO2, Arterial 205 (H) 80.0 - 100.0 mmHg   Bicarbonate 29.9 (H) 20.0 - 24.0 mEq/L   TCO2 26.0 0 - 100 mmol/L   Acid-Base Excess 2.2 (H) 0.0 - 2.0 mmol/L   O2 Saturation 99.0 %   Patient temperature 98.2    Collection site LEFT RADIAL    Drawn by [id number]    Sample type ARTERIAL DRAW    Allens test (pass/fail) PASS PASS  Troponin I (q 6hr x 3)     Status: Abnormal   Collection Time: 09/14/15  3:02 AM  Result Value Ref Range   Troponin I 0.16 (H) <0.031 ng/mL    Comment:        PERSISTENTLY INCREASED TROPONIN VALUES IN THE RANGE OF 0.04-0.49 ng/mL CAN BE SEEN IN:       -UNSTABLE ANGINA       -CONGESTIVE HEART FAILURE       -MYOCARDITIS       -CHEST TRAUMA       -ARRYHTHMIAS       -LATE PRESENTING MYOCARDIAL INFARCTION       -COPD   CLINICAL FOLLOW-UP RECOMMENDED.   Basic metabolic panel     Status: Abnormal   Collection Time: 09/14/15  3:55 AM  Result Value Ref Range   Sodium 132 (L) 135 - 145 mmol/L   Potassium 4.2 3.5 - 5.1 mmol/L   Chloride 94 (L) 101 - 111 mmol/L   CO2 30 22 - 32 mmol/L   Glucose, Bld 113 (H) 65 - 99 mg/dL   BUN 24 (H) 6  - 20 mg/dL   Creatinine, Ser 1.27 (H) 0.61 - 1.24 mg/dL   Calcium 8.6 (L) 8.9 - 10.3 mg/dL   GFR calc non Af Amer 53 (L) >60 mL/min   GFR calc Af Amer >60 >60 mL/min    Comment: (NOTE) The eGFR has been calculated using the CKD EPI equation. This calculation has not been validated in all clinical situations. eGFR's persistently <60 mL/min signify possible Chronic Kidney Disease.    Anion gap 8 5 - 15   Ct Head Wo Contrast  09/13/2015   CLINICAL DATA:  Slurred speech.  EXAM: CT HEAD WITHOUT CONTRAST  TECHNIQUE: Contiguous axial images were obtained from the base of the skull through the vertex without intravenous contrast.  COMPARISON:  None.  FINDINGS: The brainstem, cerebellum, cerebral peduncles, thalamus, basal ganglia, basilar cisterns, and ventricular system appear within normal limits. Periventricular white matter and corona radiata hypodensities favor chronic ischemic microvascular white matter disease. No intracranial hemorrhage, mass lesion, or acute CVA.  Chronic ethmoid and left maxillary sinusitis.  IMPRESSION: 1. No acute intracranial findings. 2. Periventricular white matter and corona radiata hypodensities favor chronic ischemic microvascular white matter disease. 3. Chronic ethmoid and left maxillary sinusitis.   Electronically Signed   By: Van Clines M.D.   On: 09/13/2015 17:12   Dg Chest Portable 1 View  09/13/2015   CLINICAL DATA:  Dyspnea.  Fatigue.  Altered speech.  EXAM: PORTABLE CHEST 1 VIEW  COMPARISON:  None.  FINDINGS: Midline trachea.  Normal heart size for level of inspiration. Probable small left pleural effusion. No pneumothorax. Patchy left base opacities, obscuring the left hemidiaphragm.  IMPRESSION: Suspicion of left lower lobe airspace disease/ pneumonia with small left pleural effusion. Consider PA and lateral radiographs for further evaluation.   Electronically Signed   By: Abigail Miyamoto M.D.   On: 09/13/2015 17:21    ROS: as best pt could  remember General:no colds or fevers, no weight changes Skin:no rashes or ulcers HEENT:no blurred vision, no congestion CV:see HPI PUL:see HPI GI:no diarrhea constipation or melena, no indigestion GU:no hematuria, no dysuria MS:no joint pain, no claudication Neuro:no syncope, no lightheadedness Endo:no diabetes, no thyroid disease  No Meds,   Does not see any MDs   Blood pressure 94/47, pulse 73, temperature 98.5 F (36.9 C), temperature source Axillary, resp. rate 15, height 5' 7"  (1.702 m), weight 158 lb 8.2 oz (71.9 kg), SpO2 94 %.  Wt Readings from Last 3 Encounters:  09/13/15 158 lb 8.2 oz (71.9 kg)    PE: General:Pleasant affect, NAD Skin:Warm and dry, brisk capillary refill HEENT:normocephalic, sclera clear, mucus membranes moist Neck:supple, + JVD, no bruits  Heart:S1S2 RRR without murmur, gallup, rub or click Lungs:  without rales +, rhonchi, or wheezes BJY:NWGN, non tender, + BS, do not palpate liver spleen or masses Ext:2+ lower ext edema, 2+ pedal pulses, 2+ radial pulses Neuro:alert and oriented, MAE, follows commands, + facial symmetry  tele:  SR currently with PACs.  Echo:  - Left ventricle: The cavity size was normal. Wall thickness was increased with mild hypertrophy of the posterior wall and moderate thickness of the septal wall. Systolic function was moderately to severely reduced. The estimated ejection fraction was in the range of 30% to 35%. Diffuse hypokinesis. Features are consistent with a pseudonormal left ventricular filling pattern, with concomitant abnormal relaxation and increased filling pressure (grade 2 diastolic dysfunction). Doppler parameters are consistent with high ventricular filling pressure. - Aortic valve: There was moderate regurgitation. - Mitral valve: There was mild regurgitation. - Right ventricle: The cavity size was moderately dilated. Wall thickness was normal. Systolic function was normal. - Pulmonary  arteries: PA peak pressure: 36 mm Hg (S). - Pericardium, extracardiac: There was a moderate-sized left pleural effusion. - Impressions: Challenging to assess wall motion and systolic function due to frequent ectopy.  Assessment/Plan Principal Problem:   Severe sepsis with acute organ dysfunction Active Problems:   Encephalopathy acute   AKI (acute kidney injury)   Atrial fibrillation   NSTEMI (non-ST elevated myocardial infarction)   Acute congestive heart failure   CAP (community acquired pneumonia)   Severe sepsis  1. A fib:  Would add IV heparin for now, SR with bursts of SVT but A fib on admit unsure on length of time.  CHA2DS2VASc 3. Recheck EKG.  We can decide later on DOAC.    2. Elevated troponin more of a flat trend currently, could be due to demand ischemia with acute illness in combination with PAF vs. NSTEMI.    Will need at least lexiscan myoview depending on course.  No chest pain.  Start 81 mg ASA.    3. hypoxia continues on BiPAP per IM  4. Hyponatremia- per IM   5. Sepsis and CAP per IM.  6.  Cardiomyopathy:  Dilated.  EF as above.  Possibly related to acute illness.  Not the primary issue at this point.  He will need medical management.   [place]  Nurse Practitioner Certified [facility] Pager [phone] or after 5pm or weekends call [phone] 09/14/2015, 8:45 AM    History and all data above reviewed.  Patient examined.  I agree with the findings as above.  I was able to talk to the patient who is confused and to his ex wife who provided history.  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  However, he can now longer play secondary to joint problems.  He has had no recent cardiac symptoms.  Denies any chest pain or SOB.  He apparently does drink ETOH to excess per his ex wife.   The patient exam reveals COR:RRR  ,  Lungs: Diffuse wheezing  ,  Abd: Positive bowel sounds, no rebound no guarding, Ext No edema  .  All  available labs, radiology testing, previous records reviewed. Agree with documented assessment and plan. Cardiomyopathy:  Likely not ischemic.  Either related to sepsis or ETOH.  Medical management for now.  Possible Lexiscan Myoview which could be done after discharge.  Atrial fib has resolved.  He will need long term DOAC.  We will follow and begin to titrate beta blocker, ARB once his BP is less labile.    Jeneen Rinks Hochrein  11:45 AM  09/14/2015

## 2015-09-14 NOTE — Progress Notes (Signed)
  Echocardiogram 2D Echocardiogram has been performed.  Mark Mullen 09/14/2015, 9:45 AM

## 2015-09-14 NOTE — Progress Notes (Signed)
PROGRESS NOTE  [name] WUJ:[id number] DOB: July 28, 1935 DOA: 09/13/2015 PCP: No primary care provider on file.  HPI/Recap of past 24 hours: Patient is a 80 year old male with no known past medical history who was brought in after being found to be confused and hypoxic. In emergency room, patient noted to be in atrial fibrillation (new) with signs of volume overload as well as sepsis felt to be secondary to a pneumonia after left lower lobe infiltrate seen on x-ray. ABG noted significant hypercarbia with a pH of 7.29 and patient started on BiPAP and admitted to the stepdown unit along with IV fluids and antibiotics.  By the following morning, patient was back in a normal sinus rhythm. Cardiology was consulted. Patient started waking up and becoming more alert. By midafternoon, 9/27, ABG notes much improvement of hypoxia and patient able to be weaned down to 2 L nasal cannula. He is much more alert and interactive. Echocardiogram noted grade 2 diastolic dysfunction and decreased ejection fraction of 30-35 percent. Patient himself with no complaints. No chest pain and his breathing much more comfortably.  Assessment/Plan: Principal Problem:   Severe sepsis secondary to community-acquired pneumonia causing acute respiratory failure with hypoxia and hypercarbia: Organism unknown. Source pneumonia. Patient meets criteria given tachypnea, organ dysfunction, leukocytosis, tachycardia. Treated with IV antibiotics. Stabilized. Patient improved. Active Problems:   Encephalopathy acute: Toxic metabolic encephalopathy felt to be secondary to sepsis. Patient responding well and looks to be back near baseline   AKI (acute kidney injury): Little change with IV fluids. Suspect he has his baseline of stage I chronic kidney disease.    Atrial fibrillation: Now back in sinus rhythm. Seen by cardiology. Chads 2 vasc score of 3. Currently on IV heparin. Given that mentation is much improved, may benefit from  DOAC.  Elevated troponin: Do not think this is acute coronary syndrome, but may be more demand ischemia from sepsis and/or A. fib and/or CHF    Acute combined systolic and diastolic congestive heart failure: New finding. Daily weights. Likely cause of patient's now elevated blood pressure. Starting Lasix and following strict input/output. Given minimal renal dysfunction, would favor starting ACE inhibitor.    Malnutrition of moderate degree: Patient meets criteria for moderate malnutrition in the context of acute illness and injury. Seen by nutrition. Now that he is able to take by mouth, waiting for follow-up assessment   Code Status: Full code  Family Communication: Message left with ex-wife  Disposition Plan: Continue in stepdown until better euvolemic and more stabilized   Consultants:  Cardiology  Procedures:  Echocardiogram done 9/27: Grade 2 diastolic dysfunction plus EF 30-35 percent. Moderate aortic regurg  Antibiotics:  IV Rocephin 9/26-present  IV Zithromax 9/26-present   Objective: BP 181/69 mmHg  Pulse 76  Temp(Src) 98.7 F (37.1 C) (Axillary)  Resp 16  Ht 5\' 7"  (1.702 m)  Wt 71.9 kg (158 lb 8.2 oz)  BMI 24.82 kg/m2  SpO2 100%  Intake/Output Summary (Last 24 hours) at 09/14/15 1548 Last data filed at 09/14/15 1400  Gross per 24 hour  Intake    160 ml  Output   1050 ml  Net   -890 ml   Filed Weights   09/13/15 2050  Weight: 71.9 kg (158 lb 8.2 oz)    Exam:   General:  Alert and oriented 2, no acute distress  Cardiovascular: Regular rhythm, mild bradycardia, occasional ectopic beat  Respiratory: Decreased breath sounds bibasilar  Abdomen: Soft, nontender, nondistended, positive bowel sounds  Musculoskeletal: 1+  pitting edema from the knees down bilaterally   Data Reviewed: Basic Metabolic Panel:  Recent Labs Lab 09/13/15 1650 09/13/15 2126 09/14/15 0355  NA 131*  --  132*  K 4.8  --  4.2  CL 95*  --  94*  CO2 26  --  30   GLUCOSE 142*  --  113*  BUN 25*  --  24*  CREATININE 1.30* 1.13 1.27*  CALCIUM 9.2  --  8.6*  MG 2.0  --   --    Liver Function Tests: No results for input(s): AST, ALT, ALKPHOS, BILITOT, PROT, ALBUMIN in the last 168 hours. No results for input(s): LIPASE, AMYLASE in the last 168 hours. No results for input(s): AMMONIA in the last 168 hours. CBC:  Recent Labs Lab 09/13/15 1650 09/13/15 2126  WBC 13.1* 13.0*  NEUTROABS 10.9*  --   HGB 17.1* 16.7  HCT 49.6 50.3  MCV 91.0 92.3  PLT 237 210   Cardiac Enzymes:    Recent Labs Lab 09/13/15 1650 09/13/15 2126 09/14/15 0302 09/14/15 0946  TROPONINI 0.12* 0.16* 0.16* 0.14*   BNP (last 3 results)  Recent Labs  09/13/15 1650  BNP 1302.1*    ProBNP (last 3 results) No results for input(s): PROBNP in the last 8760 hours.  CBG: No results for input(s): GLUCAP in the last 168 hours.  Recent Results (from the past 240 hour(s))  Blood culture (routine x 2)     Status: None (Preliminary result)   Collection Time: 09/13/15  8:11 PM  Result Value Ref Range Status   Specimen Description BLOOD LEFT HAND  Final   Special Requests BOTTLES DRAWN AEROBIC AND ANAEROBIC 5CC  Final   Culture   Final    NO GROWTH < 24 HOURS Performed at [facility]    Report Status PENDING  Incomplete  MRSA PCR Screening     Status: None   Collection Time: 09/13/15  9:08 PM  Result Value Ref Range Status   MRSA by PCR NEGATIVE NEGATIVE Final    Comment:        The GeneXpert MRSA Assay (FDA approved for NASAL specimens only), is one component of a comprehensive MRSA colonization surveillance program. It is not intended to diagnose MRSA infection nor to guide or monitor treatment for MRSA infections.      Studies: Ct Head Wo Contrast  09/13/2015   CLINICAL DATA:  Slurred speech.  EXAM: CT HEAD WITHOUT CONTRAST  TECHNIQUE: Contiguous axial images were obtained from the base of the skull through the vertex without intravenous  contrast.  COMPARISON:  None.  FINDINGS: The brainstem, cerebellum, cerebral peduncles, thalamus, basal ganglia, basilar cisterns, and ventricular system appear within normal limits. Periventricular white matter and corona radiata hypodensities favor chronic ischemic microvascular white matter disease. No intracranial hemorrhage, mass lesion, or acute CVA.  Chronic ethmoid and left maxillary sinusitis.  IMPRESSION: 1. No acute intracranial findings. 2. Periventricular white matter and corona radiata hypodensities favor chronic ischemic microvascular white matter disease. 3. Chronic ethmoid and left maxillary sinusitis.   Electronically Signed   By: Gaylyn Rong M.D.   On: 09/13/2015 17:12   Dg Chest Portable 1 View  09/13/2015   CLINICAL DATA:  Dyspnea.  Fatigue.  Altered speech.  EXAM: PORTABLE CHEST 1 VIEW  COMPARISON:  None.  FINDINGS: Midline trachea. Normal heart size for level of inspiration. Probable small left pleural effusion. No pneumothorax. Patchy left base opacities, obscuring the left hemidiaphragm.  IMPRESSION: Suspicion of left lower  lobe airspace disease/ pneumonia with small left pleural effusion. Consider PA and lateral radiographs for further evaluation.   Electronically Signed   By: Jeronimo Greaves M.D.   On: 09/13/2015 17:21    Scheduled Meds: . antiseptic oral rinse  7 mL Mouth Rinse q12n4p  . azithromycin  500 mg Intravenous Q24H  . cefTRIAXone (ROCEPHIN)  IV  1 g Intravenous Q24H  . chlorhexidine  15 mL Mouth Rinse BID  . clotrimazole   Topical BID  . furosemide  20 mg Intravenous Once  . ipratropium-albuterol  3 mL Nebulization TID  . sodium chloride  3 mL Intravenous Q12H    Continuous Infusions: . heparin 1,000 Units/hr (09/14/15 1400)     Time spent: 35 minutes  Hollice Espy  [facility] Pager [phone]. If 7PM-7AM, please contact night-coverage at [email or web address], password [facility] 09/14/2015, 3:48 PM  LOS: 1 day

## 2015-09-14 NOTE — Progress Notes (Signed)
RT removed PT from BiPAP and placed on 2 lpm [place]- Sp02 97%. PT is not in respiratory distress at this time. RN aware.

## 2015-09-14 NOTE — Progress Notes (Signed)
ANTICOAGULATION CONSULT NOTE - Initial Consult  Pharmacy Consult for Heparin Indication: atrial fibrillation  No Known Allergies  Patient Measurements: Height: 5\' 7"  (170.2 cm) Weight: 158 lb 8.2 oz (71.9 kg) IBW/kg (Calculated) : 66.1 Heparin Dosing Weight: 71.9 kg  Vital Signs: Temp: 98.5 F (36.9 C) (09/27 0800) Temp Source: Axillary (09/27 0800) BP: 98/50 mmHg (09/27 0800) Pulse Rate: 77 (09/27 0800)  Labs:  Recent Labs  09/13/15 1650 09/13/15 2126 09/14/15 0302 09/14/15 0355  HGB 17.1* 16.7  --   --   HCT 49.6 50.3  --   --   PLT 237 210  --   --   CREATININE 1.30* 1.13  --  1.27*  TROPONINI 0.12* 0.16* 0.16*  --     Estimated Creatinine Clearance: 45.5 mL/min (by C-G formula based on Cr of 1.27).   Medical History: History reviewed. No pertinent past medical history.    Assessment: Patient is a 80 year old male admitted on 9/26 after being found sluggish and hypoxic at home.  Found to be in AFib with stable BP.  Patient received prophylactic dose of Enoxaparin on 9/26. Pharmacy consulted to start Heparin in the setting of new onset AFib vs ACS.    Of note, Hgb 16.7, platelets 210, elevated troponins and BNP  Goal of Therapy:  Heparin level 0.3-0.7 units/ml Monitor platelets by anticoagulation protocol: Yes   Plan:  Give 3500 units bolus x 1 Start heparin infusion at 1000 units/hr Check anti-Xa level in 8 hours and daily while on heparin Continue to monitor H&H and platelets    Kathlynn Grate 09/14/2015,10:19 AM

## 2015-09-14 NOTE — Progress Notes (Signed)
Initial Nutrition Assessment  DOCUMENTATION CODES:   Non-severe (moderate) malnutrition in context of acute illness/injury  INTERVENTION:  - Will monitor for ability to wean from CPAP and pt to consume PO - Will monitor for need for TF - RD will continue to monitor for needs  NUTRITION DIAGNOSIS:   Inadequate oral intake related to inability to eat as evidenced by other (see comment) (pt on CPAP).  GOAL:   Patient will meet greater than or equal to 90% of their needs  MONITOR:   PO intake, Weight trends, Labs, I & O's  REASON FOR ASSESSMENT:   Malnutrition Screening Tool  ASSESSMENT:   Pt presents with 1 week malaise and weakness. History is collected from the patient's ex-wife, [name] (see demographics) as the patient is unable to provide reliable history. Evidently he is somewhat reclusive, but in the last week family have been unable to reach him by phone, and yesterday sent a neighbor to check on him. He was appeared tired and sluggish, but sent the neighbor away. Today, there was enough concern that EMS were called and found the patient confused, lethargic and hypoxic.   Pt seen for MST. BMI indicates normal weight status. Pt currently on CPAP with Ve: 3.4-16.4 L/min during the 2 minutes RD spent in the room.  Mild to moderate muscle wasting noted to upper body and severe fluid accumulation/edema to BLE. No previous weight hx in the chart.   No family/visitors present in room to provide information about PTA. Anticipate pt likely not eating well over the past at least 1 week due to information in H&P as outlined above.   Not currently able to meet needs. Medications reviewed. Labs reviewed; Na: 132 mmol/L, Cl: 94 mmol/L, BUN/creatinine elevated, Ca: 8.6 mg/dL, GFR: 53.   Diet Order:  Diet regular Room service appropriate?: Yes; Fluid consistency:: Thin  Skin:  Reviewed, no issues  Last BM:  PTA  Height:   Ht Readings from Last 1 Encounters:  09/13/15 5'  7" (1.702 m)    Weight:   Wt Readings from Last 1 Encounters:  09/13/15 158 lb 8.2 oz (71.9 kg)    Ideal Body Weight:  67.27 kg (kg)  BMI:  Body mass index is 24.82 kg/(m^2).  Estimated Nutritional Needs:   Kcal:  1500-1700  Protein:  75-90 grams  Fluid:  1.5-1.7 L/day  EDUCATION NEEDS:   No education needs identified at this time     Trenton Gammon, RD, LDN Inpatient Clinical Dietitian Pager # [phone] After hours/weekend pager # [phone]

## 2015-09-14 NOTE — Progress Notes (Signed)
Informed Dr. Chancy Milroy on evening rounds that Mark Mullen was becoming agitated and confused, heart rate up and BP up at 1800. Face reddened.  Poor historian.He has been placed on CIWA Stepdown protocol.

## 2015-09-14 NOTE — Progress Notes (Addendum)
CRITICAL VALUE ALERT  Critical value received:  CO2 59.6  Date of notification:  09/14/15  Time of notification:  0030  Critical value read back:Yes.    Nurse who received alert:  Effie Berkshire  MD notified (1st page):  K Schorr  Time of first page:  0035  MD notified (2nd page):  Time of second page:  Responding MD:  Merdis Delay  Time MD responded:  0040

## 2015-09-14 NOTE — Progress Notes (Signed)
PT remains off BiPAP- is currently eating - does not appear to be in respiratory distress at this time.

## 2015-09-14 NOTE — Progress Notes (Signed)
ANTICOAGULATION CONSULT NOTE - Follow Up Consult  Pharmacy Consult for Heparin Indication: atrial fibrillation  No Known Allergies  Patient Measurements: Height: 5\' 7"  (170.2 cm) Weight: 158 lb 8.2 oz (71.9 kg) IBW/kg (Calculated) : 66.1 Heparin Dosing Weight: 71.9 kg  Vital Signs: Temp: 98.2 F (36.8 C) (09/27 1600) Temp Source: Oral (09/27 1600) BP: 171/65 mmHg (09/27 1835) Pulse Rate: 96 (09/27 1835)  Labs:  Recent Labs  09/13/15 1650 09/13/15 2126 09/14/15 0302 09/14/15 0355 09/14/15 0946 09/14/15 1858  HGB 17.1* 16.7  --   --   --   --   HCT 49.6 50.3  --   --   --   --   PLT 237 210  --   --   --   --   HEPARINUNFRC  --   --   --   --   --  0.70  CREATININE 1.30* 1.13  --  1.27*  --   --   TROPONINI 0.12* 0.16* 0.16*  --  0.14*  --     Estimated Creatinine Clearance: 45.5 mL/min (by C-G formula based on Cr of 1.27).   Medical History: History reviewed. No pertinent past medical history.    Assessment: Patient is a 80 year old male admitted on 9/26 after being found sluggish and hypoxic at home.  Found to be in AFib with stable BP.  Patient received prophylactic dose of Enoxaparin on 9/26. Pharmacy consulted to start Heparin in the setting of new onset AFib vs ACS.    Of note, Hgb 16.7, platelets 210, elevated troponins and BNP  PM Update: Heparin level = 0.7 with heparin infusing @ 1000 units/hr No complications of therapy noted  Heparin level therapeutic; however at upper end of goal    Goal of Therapy:  Heparin level 0.3-0.7 units/ml Monitor platelets by anticoagulation protocol: Yes   Plan:   Decrease IV heparin to 950 units/hr  Check heparin level ~ 8 hr after rate decreased (with AM labs)  Follow heparin level & CBC daily    Norva Bowe, Joselyn Glassman, PharmD 09/14/2015,8:16 PM

## 2015-09-14 NOTE — Progress Notes (Signed)
RT changed BiPAP settings to 14cm / 6cm (PT was revieving VT greater than 1200 while on 20cm / 6cm).

## 2015-09-14 NOTE — Care Management Note (Signed)
Case Management Note  Patient Details  Name: Mark Mullen MRN: [id number] Date of Birth: Dec 12, 1935  Subjective/Objective:          Hypoxia, NSTEMI, confusion, poss sepsis          Action/Plan:Date:  Sept. 27, 2016 U.R. performed for needs and level of care. Will continue to follow for Case Management needs.  Marcelle Smiling, RN, BSN, [place]   [phone]   Expected Discharge Date:   (unknown)               Expected Discharge Plan:  Home/Self Care  In-House Referral:  NA  Discharge planning Services  CM Consult  Post Acute Care Choice:  NA Choice offered to:  NA  DME Arranged:    DME Agency:     HH Arranged:    HH Agency:     Status of Service:  In process, will continue to follow  Medicare Important Message Given:    Date Medicare IM Given:    Medicare IM give by:    Date Additional Medicare IM Given:    Additional Medicare Important Message give by:     If discussed at Long Length of Stay Meetings, dates discussed:    Additional Comments:  Golda Acre, RN 09/14/2015, 10:16 AM

## 2015-09-15 DIAGNOSIS — J9601 Acute respiratory failure with hypoxia: Secondary | ICD-10-CM

## 2015-09-15 DIAGNOSIS — G9340 Encephalopathy, unspecified: Secondary | ICD-10-CM

## 2015-09-15 DIAGNOSIS — J189 Pneumonia, unspecified organism: Secondary | ICD-10-CM

## 2015-09-15 LAB — BASIC METABOLIC PANEL
ANION GAP: 8 (ref 5–15)
BUN: 21 mg/dL — ABNORMAL HIGH (ref 6–20)
CALCIUM: 8.5 mg/dL — AB (ref 8.9–10.3)
CO2: 30 mmol/L (ref 22–32)
Chloride: 94 mmol/L — ABNORMAL LOW (ref 101–111)
Creatinine, Ser: 1.27 mg/dL — ABNORMAL HIGH (ref 0.61–1.24)
GFR, EST NON AFRICAN AMERICAN: 53 mL/min — AB (ref >60)
GLUCOSE: 126 mg/dL — AB (ref 65–99)
Potassium: 3.9 mmol/L (ref 3.5–5.1)
SODIUM: 132 mmol/L — AB (ref 135–145)

## 2015-09-15 LAB — CBC
HCT: 51.3 % (ref 39.0–52.0)
HEMOGLOBIN: 17 g/dL (ref 13.0–17.0)
MCH: 30.7 pg (ref 26.0–34.0)
MCHC: 33.1 g/dL (ref 30.0–36.0)
MCV: 92.8 fL (ref 78.0–100.0)
Platelets: 190 10*3/uL (ref 150–400)
RBC: 5.53 MIL/uL (ref 4.22–5.81)
RDW: 14.1 % (ref 11.5–15.5)
WBC: 11.1 10*3/uL — AB (ref 4.0–10.5)

## 2015-09-15 LAB — LEGIONELLA PNEUMOPHILA SEROGP 1 UR AG: L. PNEUMOPHILA SEROGP 1 UR AG: NEGATIVE

## 2015-09-15 LAB — HEPARIN LEVEL (UNFRACTIONATED)
HEPARIN UNFRACTIONATED: 0.65 [IU]/mL (ref 0.30–0.70)
HEPARIN UNFRACTIONATED: 0.45 [IU]/mL (ref 0.30–0.70)

## 2015-09-15 MED ORDER — HEPARIN (PORCINE) IN NACL 100-0.45 UNIT/ML-% IJ SOLN
1000.0000 [IU]/h | INTRAMUSCULAR | Status: DC
  Administered 2015-09-15 – 2015-09-16 (×2): 1000 [IU]/h via INTRAVENOUS
  Filled 2015-09-15 (×4): qty 250

## 2015-09-15 MED ORDER — LISINOPRIL 10 MG PO TABS
10.0000 mg | ORAL_TABLET | Freq: Every day | ORAL | Status: DC
Start: 2015-09-15 — End: 2015-09-20
  Administered 2015-09-15 – 2015-09-20 (×5): 10 mg via ORAL
  Filled 2015-09-15 (×5): qty 1

## 2015-09-15 MED ORDER — CARVEDILOL 3.125 MG PO TABS
3.1250 mg | ORAL_TABLET | Freq: Two times a day (BID) | ORAL | Status: DC
  Administered 2015-09-15 (×2): 3.125 mg via ORAL
  Filled 2015-09-15 (×2): qty 1

## 2015-09-15 MED ORDER — FUROSEMIDE 40 MG PO TABS
40.0000 mg | ORAL_TABLET | Freq: Every day | ORAL | Status: DC
  Administered 2015-09-15 – 2015-09-20 (×5): 40 mg via ORAL
  Filled 2015-09-15 (×5): qty 1

## 2015-09-15 NOTE — Progress Notes (Signed)
[facility] PROGRESS NOTE  Mark Mullen ZOX:[id number] DOB: [birth year] DOA: 09/13/2015 PCP: No primary care provider on file.  Assessment/Plan: 1. 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 -He presented with acute hypoxemic respiratory failure requiring NPPV, having physical exam findings suggestive of CHF. -Atrial fibrillation with rapid ventricular response likely precipitating acute CHF -A transthoracic echocardiogram performed on 09/14/2015 revealed an ejection fraction of 30-35% with diffuse hypokinesis and grade 2 diastolic dysfunction. -Continue Lasix 40 mg by mouth daily.  -ACE inhibitor therapy and beta blocker would be beneficial in context of LV dysfunction.  Starting Lisinopril 10 mg by mouth daily and Coreg 3.125 mg by mouth twice a day  -Cardiomyopathy could be secondary to chronic alcoholism vs ischemia.   -Cardiology following.  2.  Acute hypercarbic respiratory failure -Initial ABG showed a PCO2 of 60.7 with a pH of 7.29. -Likely secondary to combination of acute decompensated heart failure and community acquire pneumonia. -He initially required NPPV -Patient showing clinical improvement, weaned off of NPPV  3.  Community-acquired pneumonia. -Chest x-ray showed left lower lobe airspace disease, was started on empiric IV antimicrobial therapy with azithromycin and ceftriaxone -Blood culture showing no growth to date  4.  Sepsis -Present on admission, evidenced by encephalopathy, heart rate of 115, respiratory rate of 28, white count of 13,000 -Likely secondary to community acquired pneumonia -Blood cultures remain negative, continue empiric IV antimicrobial therapy with ceftriaxone and azithromycin  5.  Probable Alcohol Withdrawal -Patient with history of chronic alcoholism, having episode of agitation overnight. This morning he was confused and disoriented. -Continue CIWA protocol  6.  Acute encephalopathy. -Likely secondary to multiple  factors including sepsis and alcohol withdrawal -Placed on CIWA protocol, continue close monitoring in the stepdown unit   Code Status: Full code Family Communication: Family not present Disposition Plan: Close monitoring in stepdown unit    Antibiotics:  Ceftriaxone  Azithromycin  HPI/Subjective: Mark Mullen is a pleasant 80 year old gentleman with a past medical history of alcohol abuse, admitted to the medicine service on 09/13/2015 when he presented with a significant functional decline, found to be confused, disoriented, lethargic. In the emergency department he was found to be in acute respiratory failure requiring NPPV as well as atrial fibrillation and having findings of acute CHF on physical examination. A portable chest x-ray revealed suspicion of left lower lobe airspace disease consistent with pneumonia. Transthoracic echocardiogram revealed an ejection fraction of 30-35% with diffuse hypokinesis as well as grade 2 diastolic dysfunction. Cardiology was consulted. He was started on IV heparin given risk for thromboembolism in setting of atrial fibrillation. I suspect that atrial fibrillation with rapid ventricular response may have precipitated acute decompensated congestive heart failure.  Objective: Filed Vitals:   09/15/15 1000  BP: 171/96  Pulse:   Temp:   Resp: 10    Intake/Output Summary (Last 24 hours) at 09/15/15 1030 Last data filed at 09/15/15 1000  Gross per 24 hour  Intake  933.5 ml  Output   1900 ml  Net -966.5 ml   Filed Weights   09/13/15 2050 09/15/15 0407  Weight: 71.9 kg (158 lb 8.2 oz) 71.6 kg (157 lb 13.6 oz)    Exam:   General:  Awake, alert, confused, disoriented, able to follow commands  Cardiovascular: Irregular rate and rhythm normal S1-S2 no murmurs rubs or gallops  Respiratory: Has rales bilaterally, few bibasilar crackles  Abdomen: Soft nontender nondistended  Musculoskeletal: 2+ bilateral extremity pitting edema  Data  Reviewed: Basic Metabolic Panel:  Recent Labs  Lab 09/13/15 1650 09/13/15 2126 09/14/15 0355 09/15/15 0350  NA 131*  --  132* 132*  K 4.8  --  4.2 3.9  CL 95*  --  94* 94*  CO2 26  --  30 30  GLUCOSE 142*  --  113* 126*  BUN 25*  --  24* 21*  CREATININE 1.30* 1.13 1.27* 1.27*  CALCIUM 9.2  --  8.6* 8.5*  MG 2.0  --   --   --    Liver Function Tests: No results for input(s): AST, ALT, ALKPHOS, BILITOT, PROT, ALBUMIN in the last 168 hours. No results for input(s): LIPASE, AMYLASE in the last 168 hours. No results for input(s): AMMONIA in the last 168 hours. CBC:  Recent Labs Lab 09/13/15 1650 09/13/15 2126 09/15/15 0350  WBC 13.1* 13.0* 11.1*  NEUTROABS 10.9*  --   --   HGB 17.1* 16.7 17.0  HCT 49.6 50.3 51.3  MCV 91.0 92.3 92.8  PLT 237 210 190   Cardiac Enzymes:  Recent Labs Lab 09/13/15 1650 09/13/15 2126 09/14/15 0302 09/14/15 0946  TROPONINI 0.12* 0.16* 0.16* 0.14*   BNP (last 3 results)  Recent Labs  09/13/15 1650  BNP 1302.1*    ProBNP (last 3 results) No results for input(s): PROBNP in the last 8760 hours.  CBG: No results for input(s): GLUCAP in the last 168 hours.  Recent Results (from the past 240 hour(s))  Blood culture (routine x 2)     Status: None (Preliminary result)   Collection Time: 09/13/15  8:11 PM  Result Value Ref Range Status   Specimen Description BLOOD LEFT HAND  Final   Special Requests BOTTLES DRAWN AEROBIC AND ANAEROBIC 5CC  Final   Culture   Final    NO GROWTH < 24 HOURS Performed at [facility]    Report Status PENDING  Incomplete  MRSA PCR Screening     Status: None   Collection Time: 09/13/15  9:08 PM  Result Value Ref Range Status   MRSA by PCR NEGATIVE NEGATIVE Final    Comment:        The GeneXpert MRSA Assay (FDA approved for NASAL specimens only), is one component of a comprehensive MRSA colonization surveillance program. It is not intended to diagnose MRSA infection nor to guide or monitor  treatment for MRSA infections.      Studies: Ct Head Wo Contrast  09/13/2015   CLINICAL DATA:  Slurred speech.  EXAM: CT HEAD WITHOUT CONTRAST  TECHNIQUE: Contiguous axial images were obtained from the base of the skull through the vertex without intravenous contrast.  COMPARISON:  None.  FINDINGS: The brainstem, cerebellum, cerebral peduncles, thalamus, basal ganglia, basilar cisterns, and ventricular system appear within normal limits. Periventricular white matter and corona radiata hypodensities favor chronic ischemic microvascular white matter disease. No intracranial hemorrhage, mass lesion, or acute CVA.  Chronic ethmoid and left maxillary sinusitis.  IMPRESSION: 1. No acute intracranial findings. 2. Periventricular white matter and corona radiata hypodensities favor chronic ischemic microvascular white matter disease. 3. Chronic ethmoid and left maxillary sinusitis.   Electronically Signed   By: Gaylyn Rong M.D.   On: 09/13/2015 17:12   Dg Chest Portable 1 View  09/13/2015   CLINICAL DATA:  Dyspnea.  Fatigue.  Altered speech.  EXAM: PORTABLE CHEST 1 VIEW  COMPARISON:  None.  FINDINGS: Midline trachea. Normal heart size for level of inspiration. Probable small left pleural effusion. No pneumothorax. Patchy left base opacities, obscuring the left hemidiaphragm.  IMPRESSION: Suspicion  of left lower lobe airspace disease/ pneumonia with small left pleural effusion. Consider PA and lateral radiographs for further evaluation.   Electronically Signed   By: Jeronimo Greaves M.D.   On: 09/13/2015 17:21    Scheduled Meds: . antiseptic oral rinse  7 mL Mouth Rinse q12n4p  . azithromycin  500 mg Intravenous Q24H  . carvedilol  3.125 mg Oral BID WC  . cefTRIAXone (ROCEPHIN)  IV  1 g Intravenous Q24H  . chlorhexidine  15 mL Mouth Rinse BID  . clotrimazole   Topical BID  . ipratropium-albuterol  3 mL Nebulization TID  . lisinopril  10 mg Oral Daily  . sodium chloride  3 mL Intravenous Q12H    Continuous Infusions: . heparin 950 Units/hr (09/15/15 0816)    Principal Problem:   Severe sepsis with acute organ dysfunction Active Problems:   Encephalopathy acute   AKI (acute kidney injury)   Atrial fibrillation   NSTEMI (non-ST elevated myocardial infarction)   Acute combined systolic and diastolic congestive heart failure   CAP (community acquired pneumonia)   Acute respiratory failure with hypoxia   Malnutrition of moderate degree    Time spent: 40 min    Jeralyn Bennett  [facility] Pager [phone]. If 7PM-7AM, please contact night-coverage at [email or web address], password [facility] 09/15/2015, 10:30 AM  LOS: 2 days

## 2015-09-15 NOTE — Progress Notes (Signed)
ANTICOAGULATION CONSULT NOTE - Follow Up Consult  Pharmacy Consult for Heparin Indication: atrial fibrillation  No Known Allergies  Patient Measurements: Height: 5\' 7"  (170.2 cm) Weight: 157 lb 13.6 oz (71.6 kg) IBW/kg (Calculated) : 66.1 Heparin Dosing Weight: 71.9 kg  Vital Signs: Temp: 98.1 F (36.7 C) (09/28 0400) Temp Source: Axillary (09/28 0400) BP: 165/87 mmHg (09/28 0459)  Labs:  Recent Labs  09/13/15 1650 09/13/15 2126 09/14/15 0302 09/14/15 0355 09/14/15 0946 09/14/15 1858 09/15/15 0350  HGB 17.1* 16.7  --   --   --   --  17.0  HCT 49.6 50.3  --   --   --   --  51.3  PLT 237 210  --   --   --   --  190  HEPARINUNFRC  --   --   --   --   --  0.70 0.65  CREATININE 1.30* 1.13  --  1.27*  --   --  1.27*  TROPONINI 0.12* 0.16* 0.16*  --  0.14*  --   --     Estimated Creatinine Clearance: 45.5 mL/min (by C-G formula based on Cr of 1.27).   Medical History: History reviewed. No pertinent past medical history.    Assessment: Patient is a 80 year old male admitted on 9/26 after being found sluggish and hypoxic at home.  Found to be in AFib with stable BP.  Patient received prophylactic dose of Enoxaparin on 9/26. Pharmacy consulted to start Heparin in the setting of new onset AFib.  Of note, Hgb 16.7, platelets 210, elevated troponins and BNP CHADS2VASc = 3 (Age x2, CHF)  9/26 >> Enoxaparin 40 mg SQ 9/27 >> Heparin 3500 units x 1, then 1000 units/hr 9/27 >> HL = 0.7 at 1000 units/hr 9/28 >> HL = 0.65 at 950 units/hr 9/28 >> HL = _____ @1200   Goal of Therapy:  Heparin level 0.3-0.7 units/ml Monitor platelets by anticoagulation protocol: Yes   Plan:   Continue IV heparin 950 units/hr  Obtain 8-hr heparin level @1200   Follow heparin level & CBC daily   Follow up plan for transition to oral anticoagulation   Kathlynn Grate, PharmD candidate 09/15/2015,7:49 AM

## 2015-09-15 NOTE — Progress Notes (Signed)
Subjective: denies chest pain or SOB  Objective: Vital signs in last 24 hours: Temp:  [97.4 F (36.3 C)-98.2 F (36.8 C)] 97.4 F (36.3 C) (09/28 0800) Pulse Rate:  [89-96] 89 (09/28 1200) Resp:  [10-26] 12 (09/28 1200) BP: (126-191)/(45-111) 133/90 mmHg (09/28 1200) SpO2:  [94 %-100 %] 95 % (09/28 1200) FiO2 (%):  [35 %] 35 % (09/27 1354) Weight:  [157 lb 13.6 oz (71.6 kg)] 157 lb 13.6 oz (71.6 kg) (09/28 0407) Weight change: -10.6 oz (-0.3 kg) Last BM Date: 09/14/15 Intake/Output from previous day: -1234 09/27 0701 - 09/28 0700 In: 566 [I.V.:146; IV Piggyback:300] Out: 1800 [Urine:1800] Intake/Output this shift: Total I/O In: 407.5 [P.O.:360; I.V.:47.5] Out: 400 [Urine:400]  PE: General:Pleasant affect, NAD, thought the month was Feb.  Skin:Warm and dry, brisk capillary refill, + bruising HEENT:normocephalic, sclera clear, mucus membranes moist Heart:S1S2 RRR with freq premature beats without murmur, gallup, rub or click Lungs:with few rales bases and rhonchi, no wheezes UJW:JXBJ, non tender, + BS, do not palpate liver spleen or masses Ext:tr to 1+ lower ext edema, 2+ pedal pulses, 2+ radial pulses Neuro:alert and oriented X 2, MAE, follows commands, + facial symmetry Tele:  Now freq PACs bursts of SVT   Lab Results:  Recent Labs  09/13/15 2126 09/15/15 0350  WBC 13.0* 11.1*  HGB 16.7 17.0  HCT 50.3 51.3  PLT 210 190   BMET  Recent Labs  09/14/15 0355 09/15/15 0350  NA 132* 132*  K 4.2 3.9  CL 94* 94*  CO2 30 30  GLUCOSE 113* 126*  BUN 24* 21*  CREATININE 1.27* 1.27*  CALCIUM 8.6* 8.5*    Recent Labs  09/14/15 0302 09/14/15 0946  TROPONINI 0.16* 0.14*    No results found for: CHOL, HDL, LDLCALC, LDLDIRECT, TRIG, CHOLHDL No results found for: YNWG9F   Lab Results  Component Value Date   TSH 0.964 09/13/2015    Hepatic Function Panel No results for input(s): PROT, ALBUMIN, AST, ALT, ALKPHOS, BILITOT, BILIDIR, IBILI in the  last 72 hours. No results for input(s): CHOL in the last 72 hours. No results for input(s): PROTIME in the last 72 hours.     Studies/Results: Ct Head Wo Contrast  09/13/2015   CLINICAL DATA:  Slurred speech.  EXAM: CT HEAD WITHOUT CONTRAST  TECHNIQUE: Contiguous axial images were obtained from the base of the skull through the vertex without intravenous contrast.  COMPARISON:  None.  FINDINGS: The brainstem, cerebellum, cerebral peduncles, thalamus, basal ganglia, basilar cisterns, and ventricular system appear within normal limits. Periventricular white matter and corona radiata hypodensities favor chronic ischemic microvascular white matter disease. No intracranial hemorrhage, mass lesion, or acute CVA.  Chronic ethmoid and left maxillary sinusitis.  IMPRESSION: 1. No acute intracranial findings. 2. Periventricular white matter and corona radiata hypodensities favor chronic ischemic microvascular white matter disease. 3. Chronic ethmoid and left maxillary sinusitis.   Electronically Signed   By: Gaylyn Rong M.D.   On: 09/13/2015 17:12   Dg Chest Portable 1 View  09/13/2015   CLINICAL DATA:  Dyspnea.  Fatigue.  Altered speech.  EXAM: PORTABLE CHEST 1 VIEW  COMPARISON:  None.  FINDINGS: Midline trachea. Normal heart size for level of inspiration. Probable small left pleural effusion. No pneumothorax. Patchy left base opacities, obscuring the left hemidiaphragm.  IMPRESSION: Suspicion of left lower lobe airspace disease/ pneumonia with small left pleural effusion. Consider PA and lateral radiographs for further evaluation.   Electronically Signed  By: Jeronimo Greaves M.D.   On: 09/13/2015 17:21   ECHO: Study Conclusions  - Left ventricle: The cavity size was normal. Wall thickness was increased with mild hypertrophy of the posterior wall and moderate thickness of the septal wall. Systolic function was moderately to severely reduced. The estimated ejection fraction was in the range  of 30% to 35%. Diffuse hypokinesis. Features are consistent with a pseudonormal left ventricular filling pattern, with concomitant abnormal relaxation and increased filling pressure (grade 2 diastolic dysfunction). Doppler parameters are consistent with high ventricular filling pressure. - Aortic valve: There was moderate regurgitation. - Mitral valve: There was mild regurgitation. - Right ventricle: The cavity size was moderately dilated. Wall thickness was normal. Systolic function was normal. - Pulmonary arteries: PA peak pressure: 36 mm Hg (S). - Pericardium, extracardiac: There was a moderate-sized left pleural effusion. - Impressions: Challenging to assess wall motion and systolic function due to frequent ectopy.  Impressions:  - The findings indicate significant septal-lateral left ventricular wall dyssynchrony. Challenging to assess wall motion and systolic function due to frequent ectopy.   Medications: I have reviewed the patient's current medications. Scheduled Meds: . antiseptic oral rinse  7 mL Mouth Rinse q12n4p  . azithromycin  500 mg Intravenous Q24H  . carvedilol  3.125 mg Oral BID WC  . cefTRIAXone (ROCEPHIN)  IV  1 g Intravenous Q24H  . chlorhexidine  15 mL Mouth Rinse BID  . clotrimazole   Topical BID  . furosemide  40 mg Oral Daily  . ipratropium-albuterol  3 mL Nebulization TID  . lisinopril  10 mg Oral Daily  . sodium chloride  3 mL Intravenous Q12H   Continuous Infusions: . heparin 950 Units/hr (09/15/15 0816)   PRN Meds:.albuterol, LORazepam  Assessment/Plan: 80 year old was admitted by [facility] for hypoxia, sepsis from CAP, acute encephalopathy AKI, new AFib-rate control and elevated troponin though more flat at 0.12,0.16,0.16. BNP was elevated at 1302. WBC 13.1. He was placed on BiPAP.   Principal Problem:   Severe sepsis with acute organ dysfunction Active Problems:   Encephalopathy acute   AKI (acute kidney injury)    Atrial fibrillation   NSTEMI (non-ST elevated myocardial infarction)   Acute combined systolic and diastolic congestive heart failure   CAP (community acquired pneumonia)   Acute respiratory failure with hypoxia   Malnutrition of moderate degree  1. A fib: Would add IV heparin for now, SR with bursts of SVT but A fib on admit unsure on length of time.  CHA2DS2VASc 3. Recheck EKG. We can decide later on DOAC.  - now episodes of SVT at 150 ?2:1 flutter  2. Elevated troponin more of a flat trend currently, could be due to demand ischemia with acute illness in combination with PAF vs. NSTEMI. Will need at least lexiscan myoview depending on course. No chest pain. Start 81 mg ASA.   3. hypoxia continues now on nasal cannula per IM  4. Hyponatremia- per IM   5. Sepsis and CAP per IM.  6. Cardiomyopathy: Dilated. EF as above. Possibly related to acute illness. Not the primary issue at this point. He will need medical management. On coreg 3.125 and lisinopril 10 mg daily.  7. 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 CKD-3   cr. Stable 1.27   LOS: 2 days   Time spent with pt. : 15 minutes. [facility] R  Nurse Practitioner Certified Pager  [phone] or after 5pm and on weekends call [phone] 09/15/2015, 12:07 PM  History and all data above reviewed.  Patient examined.  I agree with the findings as above.   The patient is very confused.  He reports no pain and reports that he is breathing OK.  The patient exam reveals COR:RRR  ,  Lungs: Decreased breath sounds.  ,  Abd: Positive bowel sounds, no rebound no guarding, Ext No edema  .  All available labs, radiology testing, previous records reviewed. Agree with documented assessment and plan. Agree with ACE inhibitor and Coreg now that BP is improved.  We will continue to titrate meds.  With PAF will continue heparin for now and start DOAC  in AM .    Paitlyn Mcclatchey  3:07 PM  09/15/2015

## 2015-09-15 NOTE — Progress Notes (Signed)
ANTICOAGULATION CONSULT NOTE - Follow Up Consult  Pharmacy Consult for Heparin Indication: atrial fibrillation  No Known Allergies  Patient Measurements: Height: 5\' 7"  (170.2 cm) Weight: 157 lb 13.6 oz (71.6 kg) IBW/kg (Calculated) : 66.1 Heparin Dosing Weight: 71.9 kg  Vital Signs: Temp: 97.5 F (36.4 C) (09/28 1200) Temp Source: Oral (09/28 1200) BP: 133/90 mmHg (09/28 1200) Pulse Rate: 89 (09/28 1200)  Labs:  Recent Labs  09/13/15 1650 09/13/15 2126 09/14/15 0302 09/14/15 0355 09/14/15 0946 09/14/15 1858 09/15/15 0350 09/15/15 1220  HGB 17.1* 16.7  --   --   --   --  17.0  --   HCT 49.6 50.3  --   --   --   --  51.3  --   PLT 237 210  --   --   --   --  190  --   HEPARINUNFRC  --   --   --   --   --  0.70 0.65 0.45  CREATININE 1.30* 1.13  --  1.27*  --   --  1.27*  --   TROPONINI 0.12* 0.16* 0.16*  --  0.14*  --   --   --     Estimated Creatinine Clearance: 45.5 mL/min (by C-G formula based on Cr of 1.27).   Medical History: History reviewed. No pertinent past medical history.    Assessment: Patient is a 80 year old male admitted on 9/26 after being found sluggish and hypoxic at home.  Found to be in AFib with stable BP.  Patient received prophylactic dose of Enoxaparin on 9/26. Pharmacy consulted to start Heparin in the setting of new onset AFib.  Of note, Hgb 16.7, platelets 210, elevated troponins and BNP CHADS2VASc = 3 (Age x2, CHF)  9/26 >> Enoxaparin 40 mg SQ 9/27 >> Heparin 3500 units x 1, then 1000 units/hr 9/27 >> HL = 0.7 at 1000 units/hr 9/28 >> HL = 0.65 at 950 units/hr 9/28 >> HL = 0.45 at 950 units/hr (therapeutic but trending down)  Goal of Therapy:  Heparin level 0.3-0.7 units/ml Monitor platelets by anticoagulation protocol: Yes   Plan:   Increase IV heparin slightly to 1000 units/hr to prevent further trend down  Follow heparin level & CBC daily   Follow up plan for transition to oral anticoagulation   Loralee Pacas,  PharmD, BCPS Pager: [phone] 09/15/2015,1:44 PM

## 2015-09-16 DIAGNOSIS — F10239 Alcohol dependence with withdrawal, unspecified: Secondary | ICD-10-CM | POA: Insufficient documentation

## 2015-09-16 DIAGNOSIS — F10231 Alcohol dependence with withdrawal delirium: Secondary | ICD-10-CM

## 2015-09-16 DIAGNOSIS — F10939 Alcohol use, unspecified with withdrawal, unspecified: Secondary | ICD-10-CM | POA: Insufficient documentation

## 2015-09-16 LAB — CBC
HCT: 53.6 % — ABNORMAL HIGH (ref 39.0–52.0)
Hemoglobin: 18 g/dL — ABNORMAL HIGH (ref 13.0–17.0)
MCH: 31.1 pg (ref 26.0–34.0)
MCHC: 33.6 g/dL (ref 30.0–36.0)
MCV: 92.7 fL (ref 78.0–100.0)
PLATELETS: 202 10*3/uL (ref 150–400)
RBC: 5.78 MIL/uL (ref 4.22–5.81)
RDW: 14.1 % (ref 11.5–15.5)
WBC: 10.8 10*3/uL — ABNORMAL HIGH (ref 4.0–10.5)

## 2015-09-16 LAB — BASIC METABOLIC PANEL
Anion gap: 9 (ref 5–15)
BUN: 19 mg/dL (ref 6–20)
CHLORIDE: 91 mmol/L — AB (ref 101–111)
CO2: 35 mmol/L — AB (ref 22–32)
CREATININE: 1.1 mg/dL (ref 0.61–1.24)
Calcium: 8.9 mg/dL (ref 8.9–10.3)
GFR calc non Af Amer: >60 mL/min (ref >60)
Glucose, Bld: 126 mg/dL — ABNORMAL HIGH (ref 65–99)
Potassium: 4.1 mmol/L (ref 3.5–5.1)
Sodium: 135 mmol/L (ref 135–145)

## 2015-09-16 LAB — HEPARIN LEVEL (UNFRACTIONATED): Heparin Unfractionated: 0.68 IU/mL (ref 0.30–0.70)

## 2015-09-16 MED ORDER — FOLIC ACID 1 MG PO TABS
1.0000 mg | ORAL_TABLET | Freq: Every day | ORAL | Status: DC
  Administered 2015-09-17 – 2015-09-20 (×4): 1 mg via ORAL
  Filled 2015-09-16 (×4): qty 1

## 2015-09-16 MED ORDER — THIAMINE HCL 100 MG/ML IJ SOLN
100.0000 mg | Freq: Every day | INTRAMUSCULAR | Status: DC
  Administered 2015-09-16 – 2015-09-17 (×2): 100 mg via INTRAVENOUS
  Filled 2015-09-16 (×2): qty 2

## 2015-09-16 MED ORDER — LORAZEPAM 2 MG/ML IJ SOLN: 0.0000 mg | Freq: Two times a day (BID) | INTRAMUSCULAR | Status: AC

## 2015-09-16 MED ORDER — VITAMIN B-1 100 MG PO TABS
100.0000 mg | ORAL_TABLET | Freq: Every day | ORAL | Status: DC
  Administered 2015-09-18 – 2015-09-20 (×3): 100 mg via ORAL
  Filled 2015-09-16 (×3): qty 1

## 2015-09-16 MED ORDER — ADULT MULTIVITAMIN W/MINERALS CH
1.0000 | ORAL_TABLET | Freq: Every day | ORAL | Status: DC
  Administered 2015-09-18 – 2015-09-20 (×3): 1 via ORAL
  Filled 2015-09-16 (×3): qty 1

## 2015-09-16 MED ORDER — IPRATROPIUM-ALBUTEROL 0.5-2.5 (3) MG/3ML IN SOLN: 3.0000 mL | Freq: Four times a day (QID) | RESPIRATORY_TRACT | Status: DC | PRN

## 2015-09-16 MED ORDER — CETYLPYRIDINIUM CHLORIDE 0.05 % MT LIQD
7.0000 mL | Freq: Two times a day (BID) | OROMUCOSAL | Status: DC
  Administered 2015-09-16 – 2015-09-20 (×7): 7 mL via OROMUCOSAL

## 2015-09-16 MED ORDER — LABETALOL HCL 5 MG/ML IV SOLN
10.0000 mg | INTRAVENOUS | Status: DC | PRN
  Filled 2015-09-16: qty 4

## 2015-09-16 MED ORDER — CARVEDILOL 6.25 MG PO TABS
6.2500 mg | ORAL_TABLET | Freq: Two times a day (BID) | ORAL | Status: DC
  Administered 2015-09-16 – 2015-09-17 (×2): 6.25 mg via ORAL
  Filled 2015-09-16 (×2): qty 1

## 2015-09-16 MED ORDER — LORAZEPAM 2 MG/ML IJ SOLN: 0.0000 mg | Freq: Four times a day (QID) | INTRAMUSCULAR | Status: AC

## 2015-09-16 MED ORDER — LABETALOL HCL 5 MG/ML IV SOLN
10.0000 mg | Freq: Once | INTRAVENOUS | Status: AC
  Administered 2015-09-16: 10 mg via INTRAVENOUS
  Filled 2015-09-16: qty 4

## 2015-09-16 NOTE — Progress Notes (Signed)
[facility] PROGRESS NOTE  Mark Mullen WEX:[id number] DOB: 08-23-1935 DOA: 09/13/2015 PCP: No primary care provider on file.  Assessment/Plan: 1. 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 -He presented with acute hypoxemic respiratory failure requiring NPPV, having physical exam findings suggestive of CHF. -Atrial fibrillation with rapid ventricular response likely precipitating acute CHF -A transthoracic echocardiogram performed on 09/14/2015 revealed an ejection fraction of 30-35% with diffuse hypokinesis and grade 2 diastolic dysfunction. -Continue Lasix 40 mg by mouth daily.  -ACE inhibitor therapy and beta blocker would be beneficial in context of LV dysfunction.  Lisinopril 10 mg by mouth daily and increase Coreg to 6.125 mg by mouth twice a day  -Cardiomyopathy could be secondary to chronic alcoholism vs ischemia.   -Case discussed with cardiology who did not recommend ischemic workup at this time. Continue maximize medical therapy.  2. Atrial fibrillation with CHADSVasc score of 3.  -Suspect precipitated by car to myopathy and alcohol abuse/withdrawal -He was initially placed on IV heparin, plan to transition to oral anticoagulant was tolerating by mouth intake.  3.  Acute hypercarbic respiratory failure -Initial ABG showed a PCO2 of 60.7 with a pH of 7.29. -Likely secondary to combination of acute decompensated heart failure and community acquire pneumonia. -He initially required NPPV -Currently stabilizing a respiratory standpoint  4.  Community-acquired pneumonia. -Chest x-ray showed left lower lobe airspace disease, was started on empiric IV antimicrobial therapy with azithromycin and ceftriaxone -Blood culture showing no growth to date  5.  Sepsis -Present on admission, evidenced by encephalopathy, heart rate of 115, respiratory rate of 28, white count of 13,000 -Likely secondary to community acquired pneumonia -Blood cultures remain negative,  continue empiric IV antimicrobial therapy with ceftriaxone and azithromycin  6.  Probable Alcohol Withdrawal -Overnight patient was given 2 mg of IV Ativan, was sedated on my examination this morning -Ativan dose was decreased   Code Status: Full code Family Communication: Family not present Disposition Plan: Close monitoring in stepdown unit    Antibiotics:  Ceftriaxone  Azithromycin  HPI/Subjective: Mark Mullen is a pleasant 80 year old gentleman with a past medical history of alcohol abuse, admitted to the medicine service on 09/13/2015 when he presented with a significant functional decline, found to be confused, disoriented, lethargic. In the emergency department he was found to be in acute respiratory failure requiring NPPV as well as atrial fibrillation and having findings of acute CHF on physical examination. A portable chest x-ray revealed suspicion of left lower lobe airspace disease consistent with pneumonia. Transthoracic echocardiogram revealed an ejection fraction of 30-35% with diffuse hypokinesis as well as grade 2 diastolic dysfunction. Cardiology was consulted. He was started on IV heparin given risk for thromboembolism in setting of atrial fibrillation. I suspect that atrial fibrillation with rapid ventricular response may have precipitated acute decompensated congestive heart failure.  Objective: Filed Vitals:   09/16/15 1400  BP: 141/81  Pulse:   Temp:   Resp: 14    Intake/Output Summary (Last 24 hours) at 09/16/15 1455 Last data filed at 09/16/15 1400  Gross per 24 hour  Intake 859.83 ml  Output    300 ml  Net 559.83 ml   Filed Weights   09/13/15 2050 09/15/15 0407 09/16/15 1005  Weight: 71.9 kg (158 lb 8.2 oz) 71.6 kg (157 lb 13.6 oz) 70.8 kg (156 lb 1.4 oz)    Exam:   General:  Patient has sedated, difficult to arouse  Cardiovascular: Irregular rate and rhythm normal S1-S2 no murmurs rubs or gallops  Respiratory: Has rales bilaterally, few  bibasilar crackles  Abdomen: Soft nontender nondistended  Musculoskeletal: 1+ bilateral extremity pitting edema  Data Reviewed: Basic Metabolic Panel:  Recent Labs Lab 09/13/15 1650 09/13/15 2126 09/14/15 0355 09/15/15 0350 09/16/15 0405  NA 131*  --  132* 132* 135  K 4.8  --  4.2 3.9 4.1  CL 95*  --  94* 94* 91*  CO2 26  --  30 30 35*  GLUCOSE 142*  --  113* 126* 126*  BUN 25*  --  24* 21* 19  CREATININE 1.30* 1.13 1.27* 1.27* 1.10  CALCIUM 9.2  --  8.6* 8.5* 8.9  MG 2.0  --   --   --   --    Liver Function Tests: No results for input(s): AST, ALT, ALKPHOS, BILITOT, PROT, ALBUMIN in the last 168 hours. No results for input(s): LIPASE, AMYLASE in the last 168 hours. No results for input(s): AMMONIA in the last 168 hours. CBC:  Recent Labs Lab 09/13/15 1650 09/13/15 2126 09/15/15 0350 09/16/15 0405  WBC 13.1* 13.0* 11.1* 10.8*  NEUTROABS 10.9*  --   --   --   HGB 17.1* 16.7 17.0 18.0*  HCT 49.6 50.3 51.3 53.6*  MCV 91.0 92.3 92.8 92.7  PLT 237 210 190 202   Cardiac Enzymes:  Recent Labs Lab 09/13/15 1650 09/13/15 2126 09/14/15 0302 09/14/15 0946  TROPONINI 0.12* 0.16* 0.16* 0.14*   BNP (last 3 results)  Recent Labs  09/13/15 1650  BNP 1302.1*    ProBNP (last 3 results) No results for input(s): PROBNP in the last 8760 hours.  CBG: No results for input(s): GLUCAP in the last 168 hours.  Recent Results (from the past 240 hour(s))  Blood culture (routine x 2)     Status: None (Preliminary result)   Collection Time: 09/13/15  8:11 PM  Result Value Ref Range Status   Specimen Description BLOOD LEFT HAND  Final   Special Requests BOTTLES DRAWN AEROBIC AND ANAEROBIC 5CC  Final   Culture   Final    NO GROWTH 3 DAYS Performed at [facility]    Report Status PENDING  Incomplete  MRSA PCR Screening     Status: None   Collection Time: 09/13/15  9:08 PM  Result Value Ref Range Status   MRSA by PCR NEGATIVE NEGATIVE Final    Comment:         The GeneXpert MRSA Assay (FDA approved for NASAL specimens only), is one component of a comprehensive MRSA colonization surveillance program. It is not intended to diagnose MRSA infection nor to guide or monitor treatment for MRSA infections.   Blood culture (routine x 2)     Status: None (Preliminary result)   Collection Time: 09/13/15  9:26 PM  Result Value Ref Range Status   Specimen Description BLOOD LEFT HAND  Final   Special Requests BOTTLES DRAWN AEROBIC AND ANAEROBIC 5CC  Final   Culture   Final    NO GROWTH 2 DAYS Performed at [facility]    Report Status PENDING  Incomplete     Studies: No results found.  Scheduled Meds: . antiseptic oral rinse  7 mL Mouth Rinse q12n4p  . azithromycin  500 mg Intravenous Q24H  . carvedilol  6.25 mg Oral BID WC  . cefTRIAXone (ROCEPHIN)  IV  1 g Intravenous Q24H  . chlorhexidine  15 mL Mouth Rinse BID  . clotrimazole   Topical BID  . folic acid  1 mg Oral  Daily  . furosemide  40 mg Oral Daily  . ipratropium-albuterol  3 mL Nebulization TID  . lisinopril  10 mg Oral Daily  . LORazepam  0-4 mg Intravenous Q6H   Followed by  . [START ON 09/18/2015] LORazepam  0-4 mg Intravenous Q12H  . multivitamin with minerals  1 tablet Oral Daily  . sodium chloride  3 mL Intravenous Q12H  . thiamine  100 mg Oral Daily   Or  . thiamine  100 mg Intravenous Daily   Continuous Infusions: . heparin 1,000 Units/hr (09/16/15 1400)    Principal Problem:   Severe sepsis with acute organ dysfunction Active Problems:   Encephalopathy acute   AKI (acute kidney injury)   Atrial fibrillation   NSTEMI (non-ST elevated myocardial infarction)   Acute combined systolic and diastolic congestive heart failure   CAP (community acquired pneumonia)   Acute respiratory failure with hypoxia   Malnutrition of moderate degree    Time spent: 35 min    ZAMORA, EZEQUIEL  [facility] Pager [phone]. If 7PM-7AM, please contact  night-coverage at [email or web address], password [facility] 09/16/2015, 2:55 PM  LOS: 3 days

## 2015-09-16 NOTE — Care Management Important Message (Signed)
Important Message  Patient Details  Name: Mark Mullen MRN: [id number] Date of Birth: [birth year]   Medicare Important Message Given:  Yes-second notification given    Renie Ora 09/16/2015, 4:20 PMImportant Message  Patient Details  Name: Mark Mullen MRN: [id number] Date of Birth: [birth year]   Medicare Important Message Given:  Yes-second notification given    Renie Ora 09/16/2015, 4:20 PM

## 2015-09-16 NOTE — Progress Notes (Signed)
Patient Profile: 80 year old male admitted by [facility] for hypoxia, sepsis from CAP, acute encephalopathy AKI, new AFib-rate control and elevated troponin though more flat at 0.12,0.16,0.16. BNP was elevated at 1302. WBC 13.1. EF 30-35% on echo 09/14/15.  Subjective: Drowsy this am. RN notes he was given ativan last night for agitation. No distress.   Objective: Vital signs in last 24 hours: Temp:  [97.3 F (36.3 C)-97.8 F (36.6 C)] 97.8 F (36.6 C) (09/29 0400) Pulse Rate:  [74-95] 81 (09/28 2200) Resp:  [10-31] 22 (09/29 0600) BP: (117-191)/(69-101) 161/100 mmHg (09/29 0600) SpO2:  [90 %-98 %] 95 % (09/28 2200) Last BM Date: 09/14/15  Intake/Output from previous day: 09/28 0701 - 09/29 0700 In: 1395.3 [P.O.:840; I.V.:255.3; IV Piggyback:300] Out: 1600 [Urine:1600] Intake/Output this shift:    Medications Current Facility-Administered Medications  Medication Dose Route Frequency Provider Last Rate Last Dose  . albuterol (PROVENTIL) (2.5 MG/3ML) 0.083% nebulizer solution 2.5 mg  2.5 mg Nebulization Q4H PRN Alberteen Sam, MD      . antiseptic oral rinse (CPC / CETYLPYRIDINIUM CHLORIDE 0.05%) solution 7 mL  7 mL Mouth Rinse q12n4p Alberteen Sam, MD   7 mL at 09/15/15 1630  . azithromycin (ZITHROMAX) 500 mg in dextrose 5 % 250 mL IVPB  500 mg Intravenous Q24H Alberteen Sam, MD   500 mg at 09/15/15 2033  . carvedilol (COREG) tablet 6.25 mg  6.25 mg Oral BID WC Jeralyn Bennett, MD      . cefTRIAXone (ROCEPHIN) 1 g in dextrose 5 % 50 mL IVPB  1 g Intravenous Q24H Alberteen Sam, MD   1 g at 09/15/15 1729  . chlorhexidine (PERIDEX) 0.12 % solution 15 mL  15 mL Mouth Rinse BID Alberteen Sam, MD   15 mL at 09/15/15 2121  . clotrimazole (LOTRIMIN) 1 % cream   Topical BID Roma Kayser Schorr, NP      . furosemide (LASIX) tablet 40 mg  40 mg Oral Daily Jeralyn Bennett, MD   40 mg at 09/15/15 1133  . heparin ADULT infusion 100 units/mL (25000  units/250 mL)  1,000 Units/hr Intravenous Continuous Rollene Fare, RPH 10 mL/hr at 09/16/15 0700 1,000 Units/hr at 09/16/15 0700  . ipratropium-albuterol (DUONEB) 0.5-2.5 (3) MG/3ML nebulizer solution 3 mL  3 mL Nebulization TID Alberteen Sam, MD   3 mL at 09/15/15 1937  . lisinopril (PRINIVIL,ZESTRIL) tablet 10 mg  10 mg Oral Daily Jeralyn Bennett, MD   10 mg at 09/15/15 0924  . LORazepam (ATIVAN) injection 2-3 mg  2-3 mg Intravenous Q1H PRN Hollice Espy, MD   2 mg at 09/15/15 2123  . sodium chloride 0.9 % injection 3 mL  3 mL Intravenous Q12H Alberteen Sam, MD   3 mL at 09/15/15 2129    PE: General appearance: drowsy, cooperative and no distress Neck: no carotid bruit and no JVD Lungs: clear to auscultation bilaterally Heart: irregular rthythm, regular rate Extremities: no LEE Pulses: 2+ and symmetric Skin: warm and dry Neurologic: Grossly normal  Lab Results:   Recent Labs  09/13/15 2126 09/15/15 0350 09/16/15 0405  WBC 13.0* 11.1* 10.8*  HGB 16.7 17.0 18.0*  HCT 50.3 51.3 53.6*  PLT 210 190 202   BMET  Recent Labs  09/14/15 0355 09/15/15 0350 09/16/15 0405  NA 132* 132* 135  K 4.2 3.9 4.1  CL 94* 94* 91*  CO2 30 30 35*  GLUCOSE 113* 126* 126*  BUN 24* 21*  19  CREATININE 1.27* 1.27* 1.10  CALCIUM 8.6* 8.5* 8.9   Filed Weights   09/13/15 2050 09/15/15 0407  Weight: 158 lb 8.2 oz (71.9 kg) 157 lb 13.6 oz (71.6 kg)    Assessment/Plan  Principal Problem:   Severe sepsis with acute organ dysfunction Active Problems:   Encephalopathy acute   AKI (acute kidney injury)   Atrial fibrillation   NSTEMI (non-ST elevated myocardial infarction)   Acute combined systolic and diastolic congestive heart failure   CAP (community acquired pneumonia)   Acute respiratory failure with hypoxia   Malnutrition of moderate degree  1. Atrial fibrillation: SR but with frequent PVCs. Rate is controlled. BP stable. Continue BB for rate control.    CHA2DS2VASc is 3. Will add DOAC today.    2. Elevated troponin:  Flat, low level trend. Could be due to demand ischemia with acute illness in combination with PAF vs. NSTEMI.2D echo shows reduced LV systolic function. No prior study to compare. Will need at least a lexiscan myoview to rule out ischemia.   3. Cardiomyopathy: Dilated. EF 30-35%. Possibly related to acute illness. Continue medical management. Currently on 6.25 mg of Coreg BID and lisinopril, 10 mg daily. Will need further titration to maximum tolerated doses.   4. 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 -1.6L out yesterday. Net negative 1.5L since admit. Renal function and electrolytes are stable. Continue lasix, 40 PO daily.  5. CKD: Stage 3.Scr. Improved, down from 1.27-->1.10.   6. Hypoxia: continues now on nasal cannula per IM  7. Hyponatremia: per IM   8. Sepsis and CAP: per IM.     LOS: 3 days    Brittainy M. Delmer Islam 09/16/2015 7:36 AM  History and all data above reviewed.  Patient examined.  I agree with the findings as above.  Somnolent.  Confused.  The patient exam reveals COR:RRR with ectopy  ,  Lungs: Clear  ,  Abd: Positive bowel sounds, no rebound no guarding, Ext Trace diffuse edema  .  All available labs, radiology testing, previous records reviewed. Agree with documented assessment and plan. CM:  Continue meds as listed.  Atrial fib:  Not taking POs at current.  OK to switch to DOAC when he is taking POs.    Fayrene Fearing Hochrein  1:06 PM  09/16/2015

## 2015-09-16 NOTE — Progress Notes (Signed)
ANTICOAGULATION CONSULT NOTE - Follow Up Consult  Pharmacy Consult for Heparin Indication: atrial fibrillation  No Known Allergies  Patient Measurements: Height: 5\' 7"  (170.2 cm) Weight: 157 lb 13.6 oz (71.6 kg) IBW/kg (Calculated) : 66.1 Heparin Dosing Weight: 71.9 kg  Vital Signs: Temp: 97.8 F (36.6 C) (09/29 0400) Temp Source: Oral (09/29 0400) BP: 161/100 mmHg (09/29 0600) Pulse Rate: 81 (09/28 2200)  Labs:  Recent Labs  09/13/15 2126 09/14/15 0302 09/14/15 0355 09/14/15 0946  09/15/15 0350 09/15/15 1220 09/16/15 0405  HGB 16.7  --   --   --   --  17.0  --  18.0*  HCT 50.3  --   --   --   --  51.3  --  53.6*  PLT 210  --   --   --   --  190  --  202  HEPARINUNFRC  --   --   --   --   < > 0.65 0.45 0.68  CREATININE 1.13  --  1.27*  --   --  1.27*  --  1.10  TROPONINI 0.16* 0.16*  --  0.14*  --   --   --   --   < > = values in this interval not displayed.  Estimated Creatinine Clearance: 52.6 mL/min (by C-G formula based on Cr of 1.1).   Medical History: History reviewed. No pertinent past medical history.    Assessment: Patient is a 80 year old male admitted on 9/26 after being found sluggish and hypoxic at home.  Found to be in AFib with stable BP.  Patient received prophylactic dose of Enoxaparin on 9/26. Pharmacy consulted to start Heparin in the setting of new onset AFib.  Of note, Hgb 16.7, platelets 210, elevated troponins and BNP CHADS2VASc = 3 (Age x2, CHF)  9/26 >> Enoxaparin 40 mg SQ 9/27 >> Heparin 3500 units x 1, then 1000 units/hr 9/27 >> HL = 0.7 on 1000 units/hr 9/28 >> HL = 0.65 on 950 units/hr 9/28 >> HL = 0.45 on 950 units/hr (therapeutic but trending down), rate increased to 1000 units/hr 8/29 >> HL = 0.68 on 1000 units/hr  Goal of Therapy:  Heparin level 0.3-0.7 units/ml Monitor platelets by anticoagulation protocol: Yes   Plan:   Continue IV heparin slightly to 1000 units/hr to prevent further trend down  Follow heparin  level & CBC daily   Per Cardiology, plan transition to DOAC today   Loralee Pacas, PharmD, BCPS Pager: [phone] 09/16/2015,7:07 AM

## 2015-09-17 LAB — BASIC METABOLIC PANEL
Anion gap: 9 (ref 5–15)
BUN: 16 mg/dL (ref 6–20)
CHLORIDE: 95 mmol/L — AB (ref 101–111)
CO2: 33 mmol/L — AB (ref 22–32)
CREATININE: 0.92 mg/dL (ref 0.61–1.24)
Calcium: 9 mg/dL (ref 8.9–10.3)
GFR calc non Af Amer: >60 mL/min (ref >60)
Glucose, Bld: 123 mg/dL — ABNORMAL HIGH (ref 65–99)
POTASSIUM: 4.3 mmol/L (ref 3.5–5.1)
SODIUM: 137 mmol/L (ref 135–145)

## 2015-09-17 LAB — CBC
HEMATOCRIT: 49.7 % (ref 39.0–52.0)
HEMOGLOBIN: 16.6 g/dL (ref 13.0–17.0)
MCH: 31.6 pg (ref 26.0–34.0)
MCHC: 33.4 g/dL (ref 30.0–36.0)
MCV: 94.5 fL (ref 78.0–100.0)
Platelets: 190 10*3/uL (ref 150–400)
RBC: 5.26 MIL/uL (ref 4.22–5.81)
RDW: 14.1 % (ref 11.5–15.5)
WBC: 10.9 10*3/uL — AB (ref 4.0–10.5)

## 2015-09-17 LAB — HEPARIN LEVEL (UNFRACTIONATED): Heparin Unfractionated: 0.51 IU/mL (ref 0.30–0.70)

## 2015-09-17 MED ORDER — RIVAROXABAN 20 MG PO TABS
20.0000 mg | ORAL_TABLET | Freq: Every day | ORAL | Status: DC
  Administered 2015-09-17 – 2015-09-19 (×3): 20 mg via ORAL
  Filled 2015-09-17 (×4): qty 1

## 2015-09-17 MED ORDER — CARVEDILOL 12.5 MG PO TABS
12.5000 mg | ORAL_TABLET | Freq: Two times a day (BID) | ORAL | Status: DC
  Administered 2015-09-17 – 2015-09-20 (×6): 12.5 mg via ORAL
  Filled 2015-09-17 (×6): qty 1

## 2015-09-17 NOTE — Evaluation (Signed)
Clinical/Bedside Swallow Evaluation Patient Details  Name: Mark Mullen MRN: [id number] Date of Birth: 1935-10-25  Today's Date: 09/17/2015 Time: SLP Start Time (ACUTE ONLY): 1051 SLP Stop Time (ACUTE ONLY): 1107 SLP Time Calculation (min) (ACUTE ONLY): 16 min  Past Medical History: History reviewed. No pertinent past medical history. Past Surgical History:  Past Surgical History  Procedure Laterality Date  . Tonsillectomy     HPI:  80 yo male admitted volume overload and sepsis secondary to PNA, probable alcohol withdrawal. No known PMH. RN noted wet vocal quality with medication administrations, and pt was observed to have a suspected aspiration event 9/30 during breakfast meal, with drop in SpO2 to the 70s and RN providing oral suctioning to remove food from pt's mouth.   Assessment / Plan / Recommendation Clinical Impression  Pt has an oral dysphagia, likely attributable to current altered mentation. He has oral holding and piecemeal swallows with thin liquids, which is alleviate with pureed consistencies. He has prolonged mastication with regular textures, but clears his oral cavity well with small amounts of thin liquids. Would recommend Dys 3 diet and thin liquids with full supervision due to current cognitive status to ensure adherence to aspiration precautions. SLP to f/u for tolerance.    Aspiration Risk  Mild    Diet Recommendation Dysphagia 3 (Mech soft);Thin   Medication Administration: Whole meds with puree Compensations: Minimize environmental distractions;Slow rate;Small sips/bites;Follow solids with liquid    Other  Recommendations Oral Care Recommendations: Oral care BID   Follow Up Recommendations   tba    Frequency and Duration min 2x/week  2 weeks   Pertinent Vitals/Pain n/a    SLP Swallow Goals     Swallow Study Prior Functional Status       General Other Pertinent Information: 80 yo male admitted volume overload and sepsis secondary to PNA,  probable alcohol withdrawal. No known PMH. RN noted wet vocal quality with medication administrations, and pt was observed to have a suspected aspiration event 9/30 during breakfast meal, with drop in SpO2 to the 70s and RN providing oral suctioning to remove food from pt's mouth. Type of Study: Bedside swallow evaluation Previous Swallow Assessment: none in chart Diet Prior to this Study: Regular;Thin liquids Temperature Spikes Noted: No Respiratory Status: Supplemental O2 delivered via (comment) ([place]) History of Recent Intubation: No Behavior/Cognition: Alert;Cooperative;Requires cueing Self-Feeding Abilities: Needs assist Patient Positioning: Upright in bed Baseline Vocal Quality: Normal    Oral/Motor/Sensory Function     Ice Chips Ice chips: Not tested   Thin Liquid Thin Liquid: Impaired Presentation: Cup;Self Fed;Straw Oral Phase Impairments: Impaired anterior to posterior transit Oral Phase Functional Implications: Oral holding;Other (comment) (piecemeal swallow) Pharyngeal  Phase Impairments: Suspected delayed Swallow    Nectar Thick Nectar Thick Liquid: Not tested   Honey Thick Honey Thick Liquid: Not tested   Puree Puree: Within functional limits Presentation: Self Fed;Spoon   Solid    Solid: Impaired Presentation: Self Fed Oral Phase Impairments: Impaired mastication      Maxcine Ham, M.A. CCC-SLP [phone]  Maxcine Ham 09/17/2015,11:20 AM

## 2015-09-17 NOTE — Progress Notes (Signed)
Handoff report given to [place], Charity fundraiser.  Patient transferring to room 1323.

## 2015-09-17 NOTE — Progress Notes (Signed)
[facility] PROGRESS NOTE  Mark Mullen OZH:[id number] DOB: [birth year] DOA: 09/13/2015 PCP: No primary care Jerrie Schussler on file.  Assessment/Plan: 1. 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 -He presented with acute hypoxemic respiratory failure requiring NPPV, having physical exam findings suggestive of CHF. -Atrial fibrillation with rapid ventricular response likely precipitating acute CHF -A transthoracic echocardiogram performed on 09/14/2015 revealed an ejection fraction of 30-35% with diffuse hypokinesis and grade 2 diastolic dysfunction. -Continue Lasix 40 mg by mouth daily.  -ACE inhibitor therapy and beta blocker would be beneficial in context of LV dysfunction.  Lisinopril 10 mg by mouth daily and increase Coreg to 6.125 mg by mouth twice a day  -Cardiomyopathy could be secondary to chronic alcoholism vs ischemia.   -Case discussed with cardiology who did not recommend ischemic workup at this time. Continue maximize medical therapy.  2.  Alcohol Withdrawal Delirium -Patient showing sniffing clinical improvement on this morning's examination he is more awake and alert, will follow commands -Ativan dose was decreased yesterday given presence of oversedation -Plan to transfer patient out of stepdown unit to MedSurg given clinical improvement  3. Atrial fibrillation with CHADSVasc score of 3.  -Suspect precipitated by car to myopathy and alcohol abuse/withdrawal -He was initially placed on IV heparin -Will discontinue IV heparin today as he is able to take by mouth. Consult pharmacy for Xarelto dosing.  4.  Acute hypercarbic respiratory failure -Initial ABG showed a PCO2 of 60.7 with a pH of 7.29. -Likely secondary to combination of acute decompensated heart failure and community acquire pneumonia. -He initially required NPPV -Currently stabilizing a respiratory standpoint  5.  Community-acquired pneumonia. -Chest x-ray showed left lower lobe airspace  disease -Blood culture showing no growth to date -Continue IV antibody therapy with ceftriaxone and azithromycin  6. Urinary retention. -Likely secondary to IV sedatives he has received for alcohol withdrawal. -Bladder scan yesterday revealed residual of 600 mL of urine, which will catheter was placed. -Will d/c foley  Code Status: Full code Family Communication: Family not present Disposition Plan: Plan transfer patient out of stepdown unit to MedSurg    Antibiotics:  Ceftriaxone  Azithromycin  HPI/Subjective: Mark Mullen is a pleasant 80 year old gentleman with a past medical history of alcohol abuse, admitted to the medicine service on 09/13/2015 when he presented with a significant functional decline, found to be confused, disoriented, lethargic. In the emergency department he was found to be in acute respiratory failure requiring NPPV as well as atrial fibrillation and having findings of acute CHF on physical examination. A portable chest x-ray revealed suspicion of left lower lobe airspace disease consistent with pneumonia. Transthoracic echocardiogram revealed an ejection fraction of 30-35% with diffuse hypokinesis as well as grade 2 diastolic dysfunction. Cardiology was consulted. He was started on IV heparin given risk for thromboembolism in setting of atrial fibrillation. I suspect that atrial fibrillation with rapid ventricular response may have precipitated acute decompensated congestive heart failure.  Objective: Filed Vitals:   09/17/15 0635  BP: 193/93  Pulse:   Temp:   Resp: 24    Intake/Output Summary (Last 24 hours) at 09/17/15 0732 Last data filed at 09/17/15 0600  Gross per 24 hour  Intake    510 ml  Output   1510 ml  Net  -1000 ml   Filed Weights   09/13/15 2050 09/15/15 0407 09/16/15 1005  Weight: 71.9 kg (158 lb 8.2 oz) 71.6 kg (157 lb 13.6 oz) 70.8 kg (156 lb 1.4 oz)    Exam:  General:  He is awake and alert, states feeling better, does not have  signs symptoms of acute withdrawal, remains mildly confused.  Cardiovascular: Irregular rate and rhythm normal S1-S2 no murmurs rubs or gallops  Respiratory: Has rales bilaterally, few bibasilar crackles  Abdomen: Soft nontender nondistended  Musculoskeletal: 1+ bilateral extremity pitting edema  Data Reviewed: Basic Metabolic Panel:  Recent Labs Lab 09/13/15 1650 09/13/15 2126 09/14/15 0355 09/15/15 0350 09/16/15 0405 09/17/15 0400  NA 131*  --  132* 132* 135 137  K 4.8  --  4.2 3.9 4.1 4.3  CL 95*  --  94* 94* 91* 95*  CO2 26  --  30 30 35* 33*  GLUCOSE 142*  --  113* 126* 126* 123*  BUN 25*  --  24* 21* 19 16  CREATININE 1.30* 1.13 1.27* 1.27* 1.10 0.92  CALCIUM 9.2  --  8.6* 8.5* 8.9 9.0  MG 2.0  --   --   --   --   --    Liver Function Tests: No results for input(s): AST, ALT, ALKPHOS, BILITOT, PROT, ALBUMIN in the last 168 hours. No results for input(s): LIPASE, AMYLASE in the last 168 hours. No results for input(s): AMMONIA in the last 168 hours. CBC:  Recent Labs Lab 09/13/15 1650 09/13/15 2126 09/15/15 0350 09/16/15 0405 09/17/15 0400  WBC 13.1* 13.0* 11.1* 10.8* 10.9*  NEUTROABS 10.9*  --   --   --   --   HGB 17.1* 16.7 17.0 18.0* 16.6  HCT 49.6 50.3 51.3 53.6* 49.7  MCV 91.0 92.3 92.8 92.7 94.5  PLT 237 210 190 202 190   Cardiac Enzymes:  Recent Labs Lab 09/13/15 1650 09/13/15 2126 09/14/15 0302 09/14/15 0946  TROPONINI 0.12* 0.16* 0.16* 0.14*   BNP (last 3 results)  Recent Labs  09/13/15 1650  BNP 1302.1*    ProBNP (last 3 results) No results for input(s): PROBNP in the last 8760 hours.  CBG: No results for input(s): GLUCAP in the last 168 hours.  Recent Results (from the past 240 hour(s))  Blood culture (routine x 2)     Status: None (Preliminary result)   Collection Time: 09/13/15  8:11 PM  Result Value Ref Range Status   Specimen Description BLOOD LEFT HAND  Final   Special Requests BOTTLES DRAWN AEROBIC AND ANAEROBIC 5CC   Final   Culture   Final    NO GROWTH 3 DAYS Performed at [facility]    Report Status PENDING  Incomplete  MRSA PCR Screening     Status: None   Collection Time: 09/13/15  9:08 PM  Result Value Ref Range Status   MRSA by PCR NEGATIVE NEGATIVE Final    Comment:        The GeneXpert MRSA Assay (FDA approved for NASAL specimens only), is one component of a comprehensive MRSA colonization surveillance program. It is not intended to diagnose MRSA infection nor to guide or monitor treatment for MRSA infections.   Blood culture (routine x 2)     Status: None (Preliminary result)   Collection Time: 09/13/15  9:26 PM  Result Value Ref Range Status   Specimen Description BLOOD LEFT HAND  Final   Special Requests BOTTLES DRAWN AEROBIC AND ANAEROBIC 5CC  Final   Culture   Final    NO GROWTH 2 DAYS Performed at [facility]    Report Status PENDING  Incomplete     Studies: No results found.  Scheduled Meds: . antiseptic oral rinse  7 mL Mouth Rinse BID  . azithromycin  500 mg Intravenous Q24H  . carvedilol  6.25 mg Oral BID WC  . cefTRIAXone (ROCEPHIN)  IV  1 g Intravenous Q24H  . clotrimazole   Topical BID  . folic acid  1 mg Oral Daily  . furosemide  40 mg Oral Daily  . lisinopril  10 mg Oral Daily  . LORazepam  0-4 mg Intravenous Q6H   Followed by  . [START ON 09/18/2015] LORazepam  0-4 mg Intravenous Q12H  . multivitamin with minerals  1 tablet Oral Daily  . sodium chloride  3 mL Intravenous Q12H  . thiamine  100 mg Oral Daily   Or  . thiamine  100 mg Intravenous Daily   Continuous Infusions:    Principal Problem:   Severe sepsis with acute organ dysfunction Active Problems:   Encephalopathy acute   AKI (acute kidney injury)   Atrial fibrillation   NSTEMI (non-ST elevated myocardial infarction)   Acute combined systolic and diastolic congestive heart failure   CAP (community acquired pneumonia)   Acute respiratory failure with hypoxia    Malnutrition of moderate degree   Alcohol withdrawal    Time spent: 35 min    Mark Mullen, Mark Mullen  [facility] Pager [phone]. If 7PM-7AM, please contact night-coverage at [email or web address], password [email or web address] 09/17/2015, 7:32 AM  LOS: 4 days

## 2015-09-17 NOTE — Evaluation (Signed)
Physical Therapy Evaluation Patient Details Name: Mark Mullen MRN: [id number] DOB: Sep 25, 1935 Today's Date: 09/17/2015   History of Present Illness  Evidently he is somewhat reclusive, but in the last week family have been unable to reach him by phone, neighbor found patientappeared tired and sluggish,  9/26  there was enough concern that EMS were called and found the patient confused, lethargic and hypoxic, Afib. on 9/30 patient had an episode of aspiration.  Clinical Impression      Follow Up Recommendations SNF;Supervision/Assistance - 24 hour    Equipment Recommendations  None recommended by PT    Recommendations for Other Services       Precautions / Restrictions Precautions Precautions: Fall      Mobility  Bed Mobility Overal bed mobility: Needs Assistance Bed Mobility: Supine to Sit     Supine to sit: Mod assist     General bed mobility comments: extra time to mobilize, delayed  Transfers Overall transfer level: Needs assistance Equipment used: Rolling walker (2 wheeled) Transfers: Sit to/from [employer] Sit to Stand: Max assist;+2 safety/equipment Stand pivot transfers: Max assist;+2 safety/equipment       General transfer comment: patient leans posteriorly against the bed, stands to use urinal with max assist standing.  Ambulation/Gait             General Gait Details: NT  Stairs            Wheelchair Mobility    Modified Rankin (Stroke Patients Only)       Balance Overall balance assessment: History of Falls;Needs assistance Sitting-balance support: Feet supported;Bilateral upper extremity supported Sitting balance-Leahy Scale: Poor   Postural control: Posterior lean Standing balance support: During functional activity;Bilateral upper extremity supported Standing balance-Leahy Scale: Zero                               Pertinent Vitals/Pain Pain Assessment: Faces Faces Pain Scale: Hurts little  more Pain Location: urinating    Home Living Family/patient expects to be discharged to:: Private residence Living Arrangements: Alone               Additional Comments: patient unable to provide info. states he is in [place].    Prior Function Level of Independence: Independent               Hand Dominance        Extremity/Trunk Assessment   Upper Extremity Assessment: Generalized weakness           Lower Extremity Assessment: Generalized weakness      Cervical / Trunk Assessment: Kyphotic  Communication   Communication: No difficulties (communicates but not coherent 100%, does ask for urinal and states he is embarrassed that he needs help)  Cognition Arousal/Alertness: Awake/alert Behavior During Therapy: Flat affect Overall Cognitive Status: No family/caregiver present to determine baseline cognitive functioning Area of Impairment: Orientation;Following commands Orientation Level: Place;Time;Situation     Following Commands: Follows one step commands with increased time            General Comments      Exercises        Assessment/Plan    PT Assessment Patient needs continued PT services  PT Diagnosis Difficulty walking;Generalized weakness;Altered mental status   PT Problem List Decreased strength;Decreased activity tolerance;Decreased balance;Decreased mobility;Decreased cognition;Decreased safety awareness;Decreased knowledge of precautions  PT Treatment Interventions DME instruction;Gait training;Functional mobility training;Therapeutic activities;Therapeutic exercise;Patient/family education   PT Goals (Current goals can be  found in the Care Plan section) Acute Rehab PT Goals PT Goal Formulation: Patient unable to participate in goal setting Time For Goal Achievement: 10/01/15 Potential to Achieve Goals: Fair    Frequency Min 3X/week   Barriers to discharge Decreased caregiver support      Co-evaluation                End of Session Equipment Utilized During Treatment: Oxygen Activity Tolerance: Patient limited by fatigue Patient left: in chair;with call bell/phone within reach;with chair alarm set Nurse Communication: Mobility status         Time: 1700-1715 PT Time Calculation (min) (ACUTE ONLY): 15 min   Charges:   PT Evaluation $Initial PT Evaluation Tier I: 1 Procedure     PT G CodesRada Hay 09/17/2015, 5:37 PM

## 2015-09-17 NOTE — Progress Notes (Signed)
Patient Profile: 80 year old male admitted by [facility] for hypoxia, sepsis from CAP, acute encephalopathy AKI, new AFib-rate control and elevated troponin though more flat at 0.12,0.16,0.16. BNP was elevated at 1302. WBC 13.1. EF 30-35% on echo 09/14/15.  Subjective: More alert today but had a BM in the bed. Nurses are getting him cleaned up. No chest pain or dyspnea.   Objective: Vital signs in last 24 hours: Temp:  [97.5 F (36.4 C)-98.1 F (36.7 C)] 97.9 F (36.6 C) (09/30 0758) Resp:  [14-27] 15 (09/30 1026) BP: (109-193)/(57-103) 172/92 mmHg (09/30 0758) SpO2:  [78 %-100 %] 97 % (09/30 1026) Weight:  [154 lb 15.7 oz (70.3 kg)] 154 lb 15.7 oz (70.3 kg) (09/30 0758) Last BM Date: 09/14/15  Intake/Output from previous day: 09/29 0701 - 09/30 0700 In: 530 [I.V.:240; IV Piggyback:50] Out: 1510 [Urine:1510] Intake/Output this shift: Total I/O In: 40 [I.V.:20; Other:20] Out: 35 [Urine:35]  Medications Current Facility-Administered Medications  Medication Dose Route Frequency Provider Last Rate Last Dose  . antiseptic oral rinse (CPC / CETYLPYRIDINIUM CHLORIDE 0.05%) solution 7 mL  7 mL Mouth Rinse BID Jeralyn Bennett, MD   7 mL at 09/16/15 2127  . azithromycin (ZITHROMAX) 500 mg in dextrose 5 % 250 mL IVPB  500 mg Intravenous Q24H Alberteen Sam, MD   500 mg at 09/16/15 1945  . carvedilol (COREG) tablet 6.25 mg  6.25 mg Oral BID WC Jeralyn Bennett, MD   6.25 mg at 09/17/15 0801  . cefTRIAXone (ROCEPHIN) 1 g in dextrose 5 % 50 mL IVPB  1 g Intravenous Q24H Alberteen Sam, MD   1 g at 09/16/15 1728  . clotrimazole (LOTRIMIN) 1 % cream   Topical BID Roma Kayser Schorr, NP      . folic acid (FOLVITE) tablet 1 mg  1 mg Oral Daily Jeralyn Bennett, MD   1 mg at 09/17/15 1056  . furosemide (LASIX) tablet 40 mg  40 mg Oral Daily Jeralyn Bennett, MD   40 mg at 09/17/15 0802  . ipratropium-albuterol (DUONEB) 0.5-2.5 (3) MG/3ML nebulizer solution 3 mL  3 mL Nebulization Q6H  PRN Jeralyn Bennett, MD      . labetalol (NORMODYNE,TRANDATE) injection 10 mg  10 mg Intravenous Q2H PRN Jeralyn Bennett, MD      . lisinopril (PRINIVIL,ZESTRIL) tablet 10 mg  10 mg Oral Daily Jeralyn Bennett, MD   10 mg at 09/17/15 0800  . LORazepam (ATIVAN) injection 0-4 mg  0-4 mg Intravenous Q6H Jeralyn Bennett, MD   0 mg at 09/16/15 0800   Followed by  . [START ON 09/18/2015] LORazepam (ATIVAN) injection 0-4 mg  0-4 mg Intravenous Q12H Jeralyn Bennett, MD      . multivitamin with minerals tablet 1 tablet  1 tablet Oral Daily Jeralyn Bennett, MD   1 tablet at 09/16/15 1000  . rivaroxaban (XARELTO) tablet 20 mg  20 mg Oral Q supper Rollene Fare, RPH   20 mg at 09/17/15 1056  . sodium chloride 0.9 % injection 3 mL  3 mL Intravenous Q12H Alberteen Sam, MD   3 mL at 09/16/15 2127  . thiamine (VITAMIN B-1) tablet 100 mg  100 mg Oral Daily Jeralyn Bennett, MD       Or  . thiamine (B-1) injection 100 mg  100 mg Intravenous Daily Jeralyn Bennett, MD   100 mg at 09/17/15 1059    PE: General appearance: alert, cooperative and no distress, incontinent  Neck: no carotid bruit and no  JVD Lungs: clear to auscultation bilaterally Heart: irregular rthythm, regular rate Extremities: no LEE Pulses: 2+ and symmetric Skin: warm and dry Neurologic: Grossly normal  Lab Results:   Recent Labs  09/15/15 0350 09/16/15 0405 09/17/15 0400  WBC 11.1* 10.8* 10.9*  HGB 17.0 18.0* 16.6  HCT 51.3 53.6* 49.7  PLT 190 202 190   BMET  Recent Labs  09/15/15 0350 09/16/15 0405 09/17/15 0400  NA 132* 135 137  K 3.9 4.1 4.3  CL 94* 91* 95*  CO2 30 35* 33*  GLUCOSE 126* 126* 123*  BUN 21* 19 16  CREATININE 1.27* 1.10 0.92  CALCIUM 8.5* 8.9 9.0   Filed Weights   09/15/15 0407 09/16/15 1005 09/17/15 0758  Weight: 157 lb 13.6 oz (71.6 kg) 156 lb 1.4 oz (70.8 kg) 154 lb 15.7 oz (70.3 kg)    Assessment/Plan  Principal Problem:   Severe sepsis with acute organ dysfunction Active  Problems:   Encephalopathy acute   AKI (acute kidney injury)   Atrial fibrillation   NSTEMI (non-ST elevated myocardial infarction)   Acute combined systolic and diastolic congestive heart failure   CAP (community acquired pneumonia)   Acute respiratory failure with hypoxia   Malnutrition of moderate degree   Alcohol withdrawal   1. Atrial fibrillation: SR but with frequent PVCs and occasional couplets. Rate is controlled in the 70s. BP is moderately elevated. Continue BB therapy for rate control but will increase dose of Coreg to 12.5 mg BID given low EF, frequent PVCs and moderate HTN. CHA2DS2VASc is 3. Xarelto started yesterday.     2. Elevated troponin:  Flat, low level trend. Could be due to demand ischemia with acute illness in combination with PAF vs. NSTEMI.2D echo shows reduced LV systolic function. No prior study to compare. Will need at least a lexiscan myoview to rule out ischemia.   3. Cardiomyopathy: Dilated. EF 30-35%. Possibly related to acute illness. Continue medical management. Currently on 6.25 mg of Coreg BID and lisinopril, 10 mg daily. Will need further titration to maximum tolerated doses. Will increase Coreg to 12.5 mg BID given ventricular ectopy and HTN.   4. 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 An additional 1.5L out yesterday. Net negative 2.5L since admit. Renal function and electrolytes are stable. Continue lasix, 40 PO daily.  5. CKD: Stage 3.Scr. Improved, down from 1.27-->1.10-->0.92.   6. Hypoxia: continues now on nasal cannula per IM  7. Hyponatremia:  Improved  8. Sepsis and CAP: per IM.     LOS: 4 days    Brittainy M. Sharol Harness, PA-C 09/17/2015 11:19 AM  History and all data above reviewed.  Patient examined.  I agree with the findings as above.  Much more awake today.  He denies any pain.  No SOB.  The patient exam reveals KPQ:AESLPNP with ectopy.    ,  Lungs: Clear  ,  Abd: Positive bowel  sounds, no rebound no guarding, Ext No edema  .  All available labs, radiology testing, previous records reviewed. Agree with documented assessment and plan. Atrial fib.  NSR now.  On Xarelto.  CM probably ETOH or sepsis related.  Ischemia work up as an out patient.  Continue current meds today.   Fayrene Fearing Hochrein  12:30 PM  09/17/2015

## 2015-09-17 NOTE — Progress Notes (Signed)
Date:  Sept. 30, 2016 U.R. performed for needs and level of care. Will continue to follow for Case Management needs.  Rhonda Davis, RN, BSN, CCM   [phone] 

## 2015-09-17 NOTE — Plan of Care (Signed)
Problem: Phase II Progression Outcomes Goal: Tolerating diet Outcome: Not Progressing Possible aspiration with liquids.  Swallowing evaluation ordered by the MD.

## 2015-09-17 NOTE — Progress Notes (Signed)
Patient grabbed his plate off bedside table and starting eating with the plate on his lap.  O2 sats dropped in 70's.  Patient continued to cough and had shob, while spitting up egg.  Suction the food from the patient's mouth because he did not swallow it and was unable to spit the food out.  Patient is alert and oriented to person. Notified MD.

## 2015-09-17 NOTE — Progress Notes (Signed)
PT Cancellation Note  Patient Details Name: Mark Mullen MRN: [id number] DOB: 1935/06/18   Cancelled Treatment:    Reason Eval/Treat Not Completed: Patient not medically ready (RN reports recent  possible aspirattion episode and recommends to check on patient this PM. will follow up later. )   Rada Hay 09/17/2015, 10:40 AM Blanchard Kelch PT [phone]

## 2015-09-17 NOTE — Progress Notes (Addendum)
ANTICOAGULATION CONSULT NOTE - Follow Up Consult  Pharmacy Consult for Heparin Indication: atrial fibrillation  No Known Allergies  Patient Measurements: Height: 5\' 7"  (170.2 cm) Weight: 156 lb 1.4 oz (70.8 kg) IBW/kg (Calculated) : 66.1 Heparin Dosing Weight: 71.9 kg  Vital Signs: Temp: 97.7 F (36.5 C) (09/30 0309) Temp Source: Oral (09/30 0309) BP: 193/93 mmHg (09/30 0635)  Labs:  Recent Labs  09/14/15 0946  09/15/15 0350 09/15/15 1220 09/16/15 0405 09/17/15 0400  HGB  --   < > 17.0  --  18.0* 16.6  HCT  --   --  51.3  --  53.6* 49.7  PLT  --   --  190  --  202 190  HEPARINUNFRC  --   < > 0.65 0.45 0.68 0.51  CREATININE  --   --  1.27*  --  1.10 0.92  TROPONINI 0.14*  --   --   --   --   --   < > = values in this interval not displayed.  Estimated Creatinine Clearance: 62.9 mL/min (by C-G formula based on Cr of 0.92).   Medications:  Scheduled:  . antiseptic oral rinse  7 mL Mouth Rinse BID  . azithromycin  500 mg Intravenous Q24H  . carvedilol  6.25 mg Oral BID WC  . cefTRIAXone (ROCEPHIN)  IV  1 g Intravenous Q24H  . clotrimazole   Topical BID  . folic acid  1 mg Oral Daily  . furosemide  40 mg Oral Daily  . lisinopril  10 mg Oral Daily  . LORazepam  0-4 mg Intravenous Q6H   Followed by  . [START ON 09/18/2015] LORazepam  0-4 mg Intravenous Q12H  . multivitamin with minerals  1 tablet Oral Daily  . sodium chloride  3 mL Intravenous Q12H  . thiamine  100 mg Oral Daily   Or  . thiamine  100 mg Intravenous Daily   Infusions:  . heparin 1,000 Units/hr (09/16/15 1800)   PRN: ipratropium-albuterol, labetalol  Assessment: 80 year old male admitted on 9/26 after being found sluggish and hypoxic at home. Found to be in AFib with stable BP. Patient received prophylactic dose of Enoxaparin on 9/26. Pharmacy consulted to start Heparin in the setting of new onset AFib.  Today, 09/17/2015  Heparin level remains therapeutic at 0.51 on 1000 units/hr  No  bleeding complications noted  CBC stable  Goal of Therapy:  Heparin level 0.3-0.7 units/ml Monitor platelets by anticoagulation protocol: Yes   Plan:   Continue IV heparin at 1000 units/hr  Daily heparin level and CBC  Per Cardiology, transition to DOAC when able to take POs  Loralee Pacas, PharmD, BCPS Pager: [phone] 09/17/2015,7:10 AM  Addendum: Rip Harbour to transition to Xarelto today  Stop IV heparin at 10AM, then  Begin Xarelto 20mg  PO daily for CrCl > 50 ml/min  Spoke with RN about instructions for transition  Starting 10/1, will give Xarelto with evening meal  Monitor renal function and adjust as needed  Provided patient with Xarelto education material, will need reinforcement prior to discharge.  Loralee Pacas, PharmD, BCPS 09/17/2015 7:56 AM

## 2015-09-18 DIAGNOSIS — I5041 Acute combined systolic (congestive) and diastolic (congestive) heart failure: Secondary | ICD-10-CM

## 2015-09-18 DIAGNOSIS — I4891 Unspecified atrial fibrillation: Secondary | ICD-10-CM | POA: Insufficient documentation

## 2015-09-18 LAB — BASIC METABOLIC PANEL
Anion gap: 7 (ref 5–15)
BUN: 19 mg/dL (ref 6–20)
CHLORIDE: 92 mmol/L — AB (ref 101–111)
CO2: 33 mmol/L — AB (ref 22–32)
CREATININE: 0.97 mg/dL (ref 0.61–1.24)
Calcium: 8.9 mg/dL (ref 8.9–10.3)
GFR calc Af Amer: >60 mL/min (ref >60)
GFR calc non Af Amer: >60 mL/min (ref >60)
Glucose, Bld: 125 mg/dL — ABNORMAL HIGH (ref 65–99)
Potassium: 5.2 mmol/L — ABNORMAL HIGH (ref 3.5–5.1)
Sodium: 132 mmol/L — ABNORMAL LOW (ref 135–145)

## 2015-09-18 LAB — CBC
HEMATOCRIT: 51 % (ref 39.0–52.0)
HEMOGLOBIN: 17 g/dL (ref 13.0–17.0)
MCH: 30.8 pg (ref 26.0–34.0)
MCHC: 33.3 g/dL (ref 30.0–36.0)
MCV: 92.4 fL (ref 78.0–100.0)
Platelets: 184 10*3/uL (ref 150–400)
RBC: 5.52 MIL/uL (ref 4.22–5.81)
RDW: 13.9 % (ref 11.5–15.5)
WBC: 9.7 10*3/uL (ref 4.0–10.5)

## 2015-09-18 LAB — CULTURE, BLOOD (ROUTINE X 2): Culture: NO GROWTH

## 2015-09-18 MED ORDER — AZITHROMYCIN 250 MG PO TABS
500.0000 mg | ORAL_TABLET | Freq: Every day | ORAL | Status: DC
  Administered 2015-09-18: 500 mg via ORAL
  Filled 2015-09-18: qty 2

## 2015-09-18 NOTE — Progress Notes (Signed)
SUBJECTIVE:  No complaints  OBJECTIVE:   Vitals:   Filed Vitals:   09/17/15 1421 09/17/15 2034 09/18/15 0634 09/18/15 0750  BP: 150/77 135/59 129/71 152/77  Pulse: 81 75 85 76  Temp: 98 F (36.7 C) 97.8 F (36.6 C) 97.9 F (36.6 C)   TempSrc: Oral Oral Oral   Resp: Height:      Weight:   150 lb 12.8 oz (68.402 kg)   SpO2: 98% 98% 97%    I&O's:   Intake/Output Summary (Last 24 hours) at 09/18/15 0841 Last data filed at 09/18/15 0154  Gross per 24 hour  Intake     20 ml  Output    350 ml  Net   -330 ml   TELEMETRY: Reviewed telemetry pt in NSR:     PHYSICAL EXAM General: Well developed, well nourished, in no acute distress Head: Eyes PERRLA, No xanthomas.   Normal cephalic and atramatic  Lungs:   Clear bilaterally to auscultation and percussion. Heart:   HRRR S1 S2 Pulses are 2+ & equal. Abdomen: Bowel sounds are positive, abdomen soft and non-tender without masses  Extremities:   No clubbing, cyanosis or edema.  DP +1 Neuro: Alert and oriented X 3. Psych:  Good affect, responds appropriately   LABS: Basic Metabolic Panel:  Recent Labs  13/24/40 0400 09/18/15 0433  NA 137 132*  K 4.3 5.2*  CL 95* 92*  CO2 33* 33*  GLUCOSE 123* 125*  BUN 16 19  CREATININE 0.92 0.97  CALCIUM 9.0 8.9   Liver Function Tests: No results for input(s): AST, ALT, ALKPHOS, BILITOT, PROT, ALBUMIN in the last 72 hours. No results for input(s): LIPASE, AMYLASE in the last 72 hours. CBC:  Recent Labs  09/17/15 0400 09/18/15 0433  WBC 10.9* 9.7  HGB 16.6 17.0  HCT 49.7 51.0  MCV 94.5 92.4  PLT 190 184   Cardiac Enzymes: No results for input(s): CKTOTAL, CKMB, CKMBINDEX, TROPONINI in the last 72 hours. BNP: Invalid input(s): POCBNP D-Dimer: No results for input(s): DDIMER in the last 72 hours. Hemoglobin A1C: No results for input(s): HGBA1C in the last 72 hours. Fasting Lipid Panel: No results for input(s): CHOL, HDL, LDLCALC, TRIG, CHOLHDL, LDLDIRECT in  the last 72 hours. Thyroid Function Tests: No results for input(s): TSH, T4TOTAL, T3FREE, THYROIDAB in the last 72 hours.  Invalid input(s): FREET3 Anemia Panel: No results for input(s): VITAMINB12, FOLATE, FERRITIN, TIBC, IRON, RETICCTPCT in the last 72 hours. Coag Panel:   No results found for: INR, PROTIME  RADIOLOGY: Ct Head Wo Contrast  09/13/2015   CLINICAL DATA:  Slurred speech.  EXAM: CT HEAD WITHOUT CONTRAST  TECHNIQUE: Contiguous axial images were obtained from the base of the skull through the vertex without intravenous contrast.  COMPARISON:  None.  FINDINGS: The brainstem, cerebellum, cerebral peduncles, thalamus, basal ganglia, basilar cisterns, and ventricular system appear within normal limits. Periventricular white matter and corona radiata hypodensities favor chronic ischemic microvascular white matter disease. No intracranial hemorrhage, mass lesion, or acute CVA.  Chronic ethmoid and left maxillary sinusitis.  IMPRESSION: 1. No acute intracranial findings. 2. Periventricular white matter and corona radiata hypodensities favor chronic ischemic microvascular white matter disease. 3. Chronic ethmoid and left maxillary sinusitis.   Electronically Signed   By: Gaylyn Rong M.D.   On: 09/13/2015 17:12   Dg Chest Portable 1 View  09/13/2015   CLINICAL DATA:  Dyspnea.  Fatigue.  Altered speech.  EXAM: PORTABLE CHEST 1 VIEW  COMPARISON:  None.  FINDINGS: Midline trachea. Normal heart size for level of inspiration. Probable small left pleural effusion. No pneumothorax. Patchy left base opacities, obscuring the left hemidiaphragm.  IMPRESSION: Suspicion of left lower lobe airspace disease/ pneumonia with small left pleural effusion. Consider PA and lateral radiographs for further evaluation.   Electronically Signed   By: Jeronimo Greaves M.D.   On: 09/13/2015 17:21    Assessment/Plan  Principal Problem:  Severe sepsis with acute organ dysfunction Active Problems:  Encephalopathy  acute  AKI (acute kidney injury)  Atrial fibrillation  NSTEMI (non-ST elevated myocardial infarction)  Acute combined systolic and diastolic congestive heart failure  CAP (community acquired pneumonia)  Acute respiratory failure with hypoxia  Malnutrition of moderate degree  Alcohol withdrawal   1. Atrial fibrillation: SR but with frequent PVCs and occasional couplets. Rate is controlled in the 70s. BP is moderately elevated. Continue BB therapy for rate control  Coreg increased  to 12.5 mg BID yesterday given low EF, frequent PVCs and moderate HTN. CHA2DS2VASc is 3. Xarelto started.    2. Elevated troponin: Flat, low level trend. Could be due to demand ischemia with acute illness in combination with PAF vs. NSTEMI.2D echo shows reduced LV systolic function. No prior study to compare. Will need at least a lexiscan myoview to rule out ischemia as outpt.   3. Cardiomyopathy: Dilated. EF 30-35%. Possibly related to acute illness. Continue medical management. Currently Coreg and lisinopril. Will need further titration to maximum tolerated doses.   4. 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 An additional 1.5L out yesterday. Net negative 2.9L since admit. Renal function and electrolytes are stable. Continue lasix, 40 PO daily.  5. CKD: Stage 3.Scr. Improved, down from 1.27-->1.10-->0.97.   6. Hypoxia: continues now on nasal cannula per IM  7. Hyponatremia  8. Sepsis and CAP: per IM.     Quintella Reichert, MD  09/18/2015  8:41 AM

## 2015-09-18 NOTE — Progress Notes (Signed)
[facility] PROGRESS NOTE  Rushi Chasen WUJ:[id number] DOB: 1935/08/26 DOA: 09/13/2015 PCP: No primary care provider on file.  Assessment/Plan: 1. 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 -He presented with acute hypoxemic respiratory failure requiring NPPV, having physical exam findings suggestive of CHF. -Atrial fibrillation with rapid ventricular response likely precipitating acute CHF -A transthoracic echocardiogram performed on 09/14/2015 revealed an ejection fraction of 30-35% with diffuse hypokinesis and grade 2 diastolic dysfunction. -Continue Lasix 40 mg by mouth daily.  -ACE inhibitor therapy and beta blocker would be beneficial in context of LV dysfunction.  Lisinopril 10 mg by mouth daily and increase Coreg to 12.5 mg by mouth twice a day  -Cardiomyopathy could be secondary to chronic alcoholism vs ischemia.   -He has a net negative fluid balance of 3L, will continue lasix at 40 mg PO q daily  2.  Alcohol Withdrawal Delirium -Patient showing sniffing clinical improvement on this morning's examination he is more awake and alert, will follow commands -Showing daily improvement, today we assisted him out of bed to chair.   3. Atrial fibrillation with CHADSVasc score of 3.  -Suspect precipitated by car to myopathy and alcohol abuse/withdrawal -He was initially placed on IV heparin -Now transitioned to Xarelto  4.  Acute hypercarbic respiratory failure -Initial ABG showed a PCO2 of 60.7 with a pH of 7.29. -Likely secondary to combination of acute decompensated heart failure and community acquire pneumonia. -He initially required NPPV -Currently stabilizing a respiratory standpoint   5.  Community-acquired pneumonia. -Chest x-ray showed left lower lobe airspace disease -Blood culture showing no growth to date -Continue IV antibody therapy with ceftriaxone and azithromycin  6. Urinary retention. -Likely secondary to IV sedatives he has received for  alcohol withdrawal. -Bladder scan yesterday revealed residual of 600 mL of urine, which will catheter was placed. -Foley discontinued  Code Status: Full code Family Communication: Family not present Disposition Plan: PT consult, may require SNF placement.     Antibiotics:  Ceftriaxone  Azithromycin  HPI/Subjective: Mr. Mark Mullen is a pleasant 80 year old gentleman with a past medical history of alcohol abuse, admitted to the medicine service on 09/13/2015 when he presented with a significant functional decline, found to be confused, disoriented, lethargic. In the emergency department he was found to be in acute respiratory failure requiring NPPV as well as atrial fibrillation and having findings of acute CHF on physical examination. A portable chest x-ray revealed suspicion of left lower lobe airspace disease consistent with pneumonia. Transthoracic echocardiogram revealed an ejection fraction of 30-35% with diffuse hypokinesis as well as grade 2 diastolic dysfunction. Cardiology was consulted. He was started on IV heparin given risk for thromboembolism in setting of atrial fibrillation. I suspect that atrial fibrillation with rapid ventricular response may have precipitated acute decompensated congestive heart failure.  Objective: Filed Vitals:   09/18/15 1327  BP: 104/68  Pulse: 71  Temp: 97.8 F (36.6 C)  Resp: 18    Intake/Output Summary (Last 24 hours) at 09/18/15 1331 Last data filed at 09/18/15 1300  Gross per 24 hour  Intake    240 ml  Output    450 ml  Net   -210 ml   Filed Weights   09/16/15 1005 09/17/15 0758 09/18/15 0634  Weight: 70.8 kg (156 lb 1.4 oz) 70.3 kg (154 lb 15.7 oz) 68.402 kg (150 lb 12.8 oz)    Exam:   General:  He is awake and alert, states feeling better, does not have signs symptoms of acute withdrawal, remains  mildly confused.  Cardiovascular: Irregular rate and rhythm normal S1-S2 no murmurs rubs or gallops  Respiratory: Has rales  bilaterally, few bibasilar crackles  Abdomen: Soft nontender nondistended  Musculoskeletal: 1+ bilateral extremity pitting edema  Data Reviewed: Basic Metabolic Panel:  Recent Labs Lab 09/13/15 1650  09/14/15 0355 09/15/15 0350 09/16/15 0405 09/17/15 0400 09/18/15 0433  NA 131*  --  132* 132* 135 137 132*  K 4.8  --  4.2 3.9 4.1 4.3 5.2*  CL 95*  --  94* 94* 91* 95* 92*  CO2 26  --  30 30 35* 33* 33*  GLUCOSE 142*  --  113* 126* 126* 123* 125*  BUN 25*  --  24* 21* CREATININE 1.30*  < > 1.27* 1.27* 1.10 0.92 0.97  CALCIUM 9.2  --  8.6* 8.5* 8.9 9.0 8.9  MG 2.0  --   --   --   --   --   --   < > = values in this interval not displayed. Liver Function Tests: No results for input(s): AST, ALT, ALKPHOS, BILITOT, PROT, ALBUMIN in the last 168 hours. No results for input(s): LIPASE, AMYLASE in the last 168 hours. No results for input(s): AMMONIA in the last 168 hours. CBC:  Recent Labs Lab 09/13/15 1650 09/13/15 2126 09/15/15 0350 09/16/15 0405 09/17/15 0400 09/18/15 0433  WBC 13.1* 13.0* 11.1* 10.8* 10.9* 9.7  NEUTROABS 10.9*  --   --   --   --   --   HGB 17.1* 16.7 17.0 18.0* 16.6 17.0  HCT 49.6 50.3 51.3 53.6* 49.7 51.0  MCV 91.0 92.3 92.8 92.7 94.5 92.4  PLT 237 210 190 202 190 184   Cardiac Enzymes:  Recent Labs Lab 09/13/15 1650 09/13/15 2126 09/14/15 0302 09/14/15 0946  TROPONINI 0.12* 0.16* 0.16* 0.14*   BNP (last 3 results)  Recent Labs  09/13/15 1650  BNP 1302.1*    ProBNP (last 3 results) No results for input(s): PROBNP in the last 8760 hours.  CBG: No results for input(s): GLUCAP in the last 168 hours.  Recent Results (from the past 240 hour(s))  Blood culture (routine x 2)     Status: None (Preliminary result)   Collection Time: 09/13/15  8:11 PM  Result Value Ref Range Status   Specimen Description BLOOD LEFT HAND  Final   Special Requests BOTTLES DRAWN AEROBIC AND ANAEROBIC 5CC  Final   Culture   Final    NO GROWTH 4  DAYS Performed at [facility]    Report Status PENDING  Incomplete  MRSA PCR Screening     Status: None   Collection Time: 09/13/15  9:08 PM  Result Value Ref Range Status   MRSA by PCR NEGATIVE NEGATIVE Final    Comment:        The GeneXpert MRSA Assay (FDA approved for NASAL specimens only), is one component of a comprehensive MRSA colonization surveillance program. It is not intended to diagnose MRSA infection nor to guide or monitor treatment for MRSA infections.   Blood culture (routine x 2)     Status: None (Preliminary result)   Collection Time: 09/13/15  9:26 PM  Result Value Ref Range Status   Specimen Description BLOOD LEFT HAND  Final   Special Requests BOTTLES DRAWN AEROBIC AND ANAEROBIC 5CC  Final   Culture   Final    NO GROWTH 3 DAYS Performed at [facility]    Report Status PENDING  Incomplete  Studies: No results found.  Scheduled Meds: . antiseptic oral rinse  7 mL Mouth Rinse BID  . azithromycin  500 mg Oral Q2000  . carvedilol  12.5 mg Oral BID WC  . cefTRIAXone (ROCEPHIN)  IV  1 g Intravenous Q24H  . clotrimazole   Topical BID  . folic acid  1 mg Oral Daily  . furosemide  40 mg Oral Daily  . lisinopril  10 mg Oral Daily  . LORazepam  0-4 mg Intravenous Q12H  . multivitamin with minerals  1 tablet Oral Daily  . rivaroxaban  20 mg Oral Q supper  . sodium chloride  3 mL Intravenous Q12H  . thiamine  100 mg Oral Daily   Or  . thiamine  100 mg Intravenous Daily   Continuous Infusions:    Principal Problem:   Severe sepsis with acute organ dysfunction Active Problems:   Encephalopathy acute   AKI (acute kidney injury)   Atrial fibrillation   NSTEMI (non-ST elevated myocardial infarction)   Acute combined systolic and diastolic congestive heart failure   CAP (community acquired pneumonia)   Acute respiratory failure with hypoxia   Malnutrition of moderate degree   Alcohol withdrawal    Time spent: 25  min    Jeralyn Bennett  [facility] Pager [phone]. If 7PM-7AM, please contact night-coverage at [email or web address], password [facility] 09/18/2015, 1:31 PM  LOS: 5 days

## 2015-09-18 NOTE — Progress Notes (Signed)
PHARMACIST - PHYSICIAN COMMUNICATION DR:   Vanessa Barbara CONCERNING: Antibiotic IV to Oral Route Change Policy  RECOMMENDATION: This patient is receiving Azithromycin by the intravenous route.  Based on criteria approved by the Pharmacy and Therapeutics Committee, the antibiotic(s) is/are being converted to the equivalent oral dose form(s).   DESCRIPTION: These criteria include:  Patient being treated for a respiratory tract infection, urinary tract infection, cellulitis or clostridium difficile associated diarrhea if on metronidazole  The patient is not neutropenic and does not exhibit a GI malabsorption state  The patient is eating (either orally or via tube) and/or has been taking other orally administered medications for a least 24 hours  The patient is improving clinically and has a Tmax < 100.5  If you have questions about this conversion, please contact the Pharmacy Department  []   [phone] )  [facility] []   [phone] )  [facility] []   [phone] )  [facility] []   [phone] )  [facility] [x]   [phone] )  [facility]   Haynes Hoehn, PharmD, BCPS 09/18/2015, 10:48 AM  Pager: [phone]

## 2015-09-19 LAB — BASIC METABOLIC PANEL
Anion gap: 7 (ref 5–15)
BUN: 19 mg/dL (ref 6–20)
CHLORIDE: 91 mmol/L — AB (ref 101–111)
CO2: 36 mmol/L — ABNORMAL HIGH (ref 22–32)
CREATININE: 1.15 mg/dL (ref 0.61–1.24)
Calcium: 9 mg/dL (ref 8.9–10.3)
GFR calc Af Amer: >60 mL/min (ref >60)
GFR calc non Af Amer: 60 mL/min — ABNORMAL LOW (ref >60)
Glucose, Bld: 130 mg/dL — ABNORMAL HIGH (ref 65–99)
Potassium: 3.8 mmol/L (ref 3.5–5.1)
SODIUM: 134 mmol/L — AB (ref 135–145)

## 2015-09-19 LAB — CBC
HCT: 48.3 % (ref 39.0–52.0)
HEMOGLOBIN: 16.1 g/dL (ref 13.0–17.0)
MCH: 30.5 pg (ref 26.0–34.0)
MCHC: 33.3 g/dL (ref 30.0–36.0)
MCV: 91.5 fL (ref 78.0–100.0)
PLATELETS: 216 10*3/uL (ref 150–400)
RBC: 5.28 MIL/uL (ref 4.22–5.81)
RDW: 13.9 % (ref 11.5–15.5)
WBC: 10.9 10*3/uL — ABNORMAL HIGH (ref 4.0–10.5)

## 2015-09-19 LAB — CULTURE, BLOOD (ROUTINE X 2): Culture: NO GROWTH

## 2015-09-19 NOTE — Progress Notes (Signed)
SUBJECTIVE:  No complaints  OBJECTIVE:   Vitals:   Filed Vitals:   09/19/15 0430 09/19/15 0539 09/19/15 0553 09/19/15 0729  BP: 150/82  94/40 148/88  Pulse: 53  91 101  Temp: 98 F (36.7 C)  98.3 F (36.8 C)   TempSrc: Oral  Oral   Resp: 16  16 18   Height:      Weight:  152 lb 1.6 oz (68.992 kg)    SpO2: 100%  90% 96%   I&O's:   Intake/Output Summary (Last 24 hours) at 09/19/15 7867 Last data filed at 09/19/15 0501  Gross per 24 hour  Intake    240 ml  Output    600 ml  Net   -360 ml   TELEMETRY: Reviewed telemetry pt in NSR:     PHYSICAL EXAM General: Well developed, well nourished, in no acute distress Head: Eyes PERRLA, No xanthomas.   Normal cephalic and atramatic  Lungs:   Few crackles at bases Heart:   HRRR S1 S2 Pulses are 2+ & equal. Abdomen: Bowel sounds are positive, abdomen soft and non-tender without masses  Extremities:   No clubbing, cyanosis or edema.  DP +1 Neuro: Alert and oriented X 3. Psych:  Good affect, responds appropriately   LABS: Basic Metabolic Panel:  Recent Labs  54/49/20 0433 09/19/15 0411  NA 132* 134*  K 5.2* 3.8  CL 92* 91*  CO2 33* 36*  GLUCOSE 125* 130*  BUN 19 19  CREATININE 0.97 1.15  CALCIUM 8.9 9.0   Liver Function Tests: No results for input(s): AST, ALT, ALKPHOS, BILITOT, PROT, ALBUMIN in the last 72 hours. No results for input(s): LIPASE, AMYLASE in the last 72 hours. CBC:  Recent Labs  09/18/15 0433 09/19/15 0411  WBC 9.7 10.9*  HGB 17.0 16.1  HCT 51.0 48.3  MCV 92.4 91.5  PLT 184 216   Cardiac Enzymes: No results for input(s): CKTOTAL, CKMB, CKMBINDEX, TROPONINI in the last 72 hours. BNP: Invalid input(s): POCBNP D-Dimer: No results for input(s): DDIMER in the last 72 hours. Hemoglobin A1C: No results for input(s): HGBA1C in the last 72 hours. Fasting Lipid Panel: No results for input(s): CHOL, HDL, LDLCALC, TRIG, CHOLHDL, LDLDIRECT in the last 72 hours. Thyroid Function Tests: No results  for input(s): TSH, T4TOTAL, T3FREE, THYROIDAB in the last 72 hours.  Invalid input(s): FREET3 Anemia Panel: No results for input(s): VITAMINB12, FOLATE, FERRITIN, TIBC, IRON, RETICCTPCT in the last 72 hours. Coag Panel:   No results found for: INR, PROTIME  RADIOLOGY: Ct Head Wo Contrast  09/13/2015   CLINICAL DATA:  Slurred speech.  EXAM: CT HEAD WITHOUT CONTRAST  TECHNIQUE: Contiguous axial images were obtained from the base of the skull through the vertex without intravenous contrast.  COMPARISON:  None.  FINDINGS: The brainstem, cerebellum, cerebral peduncles, thalamus, basal ganglia, basilar cisterns, and ventricular system appear within normal limits. Periventricular white matter and corona radiata hypodensities favor chronic ischemic microvascular white matter disease. No intracranial hemorrhage, mass lesion, or acute CVA.  Chronic ethmoid and left maxillary sinusitis.  IMPRESSION: 1. No acute intracranial findings. 2. Periventricular white matter and corona radiata hypodensities favor chronic ischemic microvascular white matter disease. 3. Chronic ethmoid and left maxillary sinusitis.   Electronically Signed   By: Gaylyn Rong M.D.   On: 09/13/2015 17:12   Dg Chest Portable 1 View  09/13/2015   CLINICAL DATA:  Dyspnea.  Fatigue.  Altered speech.  EXAM: PORTABLE CHEST 1 VIEW  COMPARISON:  None.  FINDINGS:  Midline trachea. Normal heart size for level of inspiration. Probable small left pleural effusion. No pneumothorax. Patchy left base opacities, obscuring the left hemidiaphragm.  IMPRESSION: Suspicion of left lower lobe airspace disease/ pneumonia with small left pleural effusion. Consider PA and lateral radiographs for further evaluation.   Electronically Signed   By: Jeronimo Greaves M.D.   On: 09/13/2015 17:21   Assessment/Plan  Principal Problem:  Severe sepsis with acute organ dysfunction Active Problems:  Encephalopathy acute  AKI (acute kidney injury)  Atrial fibrillation   NSTEMI (non-ST elevated myocardial infarction)  Acute combined systolic and diastolic congestive heart failure  CAP (community acquired pneumonia)  Acute respiratory failure with hypoxia  Malnutrition of moderate degree  Alcohol withdrawal   1. Atrial fibrillation: SR but with frequent PVCs and occasional couplets. Rate is controlled in the 70s. BP is stable. Continue BB therapy for rate control Coreg increased to 12.5 mg BID yesterday given low EF, frequent PVCs and moderate HTN. CHA2DS2VASc is 3. Xarelto started.    2. Elevated troponin: Flat, low level trend. Could be due to demand ischemia with acute illness in combination with PAF vs. NSTEMI.2D echo shows reduced LV systolic function. No prior study to compare. Will need at least a lexiscan myoview to rule out ischemia as outpt.   3. Cardiomyopathy: Dilated. EF 30-35%. Possibly related to acute illness. Continue medical management. Currently Coreg and lisinopril. Will need further titration to maximum tolerated doses.   4. 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  Net negative 3.2L since admit. Renal function and electrolytes are stable. Continue lasix, 40 PO daily.  5. CKD: Stage 3.Scr. Improved, down from 1.27-->1.10-->0.97-->1.15.   6. Hypoxia: continues now on nasal cannula per IM  7. Hyponatremia - improved  8. Sepsis and CAP: per IM.     Quintella Reichert, MD  09/19/2015  9:04 AM

## 2015-09-19 NOTE — Progress Notes (Signed)
[facility] PROGRESS NOTE  Mark Mullen EXB:[id number] DOB: 04-15-1935 DOA: 09/13/2015 PCP: No primary care provider on file.  Assessment/Plan: 1. 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 -He presented with acute hypoxemic respiratory failure requiring NPPV, having physical exam findings suggestive of CHF. -Atrial fibrillation with rapid ventricular response likely precipitating acute CHF -A transthoracic echocardiogram performed on 09/14/2015 revealed an ejection fraction of 30-35% with diffuse hypokinesis and grade 2 diastolic dysfunction. -Continue Lasix 40 mg by mouth daily.  -ACE inhibitor therapy and beta blocker would be beneficial in context of LV dysfunction.  Lisinopril 10 mg by mouth daily and increase Coreg to 12.5 mg by mouth twice a day  -Cardiomyopathy could be secondary to chronic alcoholism vs ischemia.   -Will continue lasix at 40 mg PO q daily  2.  Alcohol Withdrawal Delirium -Patient showing sniffing clinical improvement on this morning's examination he is more awake and alert, will follow commands -Improved, he was ambulated down the hallway today  3. Atrial fibrillation with CHADSVasc score of 3.  -Suspect precipitated by car to myopathy and alcohol abuse/withdrawal -He was initially placed on IV heparin -Now transitioned to Xarelto  4.  Acute hypercarbic respiratory failure -Initial ABG showed a PCO2 of 60.7 with a pH of 7.29. -Likely secondary to combination of acute decompensated heart failure and community acquire pneumonia. -He initially required NPPV -Currently stabilizing a respiratory standpoint   5.  Community-acquired pneumonia. -Will stop IV antibiotic therapy.  -He has received 7 days of antimicrobial therapy.   6. Urinary retention. -Likely secondary to IV sedatives he has received for alcohol withdrawal. -Bladder scan yesterday revealed residual of 600 mL of urine, which will catheter was placed. -Foley  discontinued  Code Status: Full code Family Communication: Family not present Disposition Plan: Plan for SNF placement in the next 24 hours    Antibiotics:  Ceftriaxone  Azithromycin  HPI/Subjective: Mark Mullen is a pleasant 80 year old gentleman with a past medical history of alcohol abuse, admitted to the medicine service on 09/13/2015 when he presented with a significant functional decline, found to be confused, disoriented, lethargic. In the emergency department he was found to be in acute respiratory failure requiring NPPV as well as atrial fibrillation and having findings of acute CHF on physical examination. A portable chest x-ray revealed suspicion of left lower lobe airspace disease consistent with pneumonia. Transthoracic echocardiogram revealed an ejection fraction of 30-35% with diffuse hypokinesis as well as grade 2 diastolic dysfunction. Cardiology was consulted. He was started on IV heparin given risk for thromboembolism in setting of atrial fibrillation. I suspect that atrial fibrillation with rapid ventricular response may have precipitated acute decompensated congestive heart failure.  Objective: Filed Vitals:   09/19/15 0729  BP: 148/88  Pulse: 101  Temp:   Resp: 18    Intake/Output Summary (Last 24 hours) at 09/19/15 1233 Last data filed at 09/19/15 0501  Gross per 24 hour  Intake    240 ml  Output    600 ml  Net   -360 ml   Filed Weights   09/17/15 0758 09/18/15 0634 09/19/15 0539  Weight: 70.3 kg (154 lb 15.7 oz) 68.402 kg (150 lb 12.8 oz) 68.992 kg (152 lb 1.6 oz)    Exam:   General:  Remains weak, needs help getting out of bed.    Cardiovascular: Irregular rate and rhythm normal S1-S2 no murmurs rubs or gallops  Respiratory: Good airmovement  Abdomen: Soft nontender nondistended  Musculoskeletal: no edema, he has generalized weakness.  Data Reviewed: Basic Metabolic Panel:  Recent Labs Lab 09/13/15 1650  09/15/15 0350 09/16/15 0405  09/17/15 0400 09/18/15 0433 09/19/15 0411  NA 131*  < > 132* 135 137 132* 134*  K 4.8  < > 3.9 4.1 4.3 5.2* 3.8  CL 95*  < > 94* 91* 95* 92* 91*  CO2 26  < > 30 35* 33* 33* 36*  GLUCOSE 142*  < > 126* 126* 123* 125* 130*  BUN 25*  < > 21* 19 16 19 19   CREATININE 1.30*  < > 1.27* 1.10 0.92 0.97 1.15  CALCIUM 9.2  < > 8.5* 8.9 9.0 8.9 9.0  MG 2.0  --   --   --   --   --   --   < > = values in this interval not displayed. Liver Function Tests: No results for input(s): AST, ALT, ALKPHOS, BILITOT, PROT, ALBUMIN in the last 168 hours. No results for input(s): LIPASE, AMYLASE in the last 168 hours. No results for input(s): AMMONIA in the last 168 hours. CBC:  Recent Labs Lab 09/13/15 1650  09/15/15 0350 09/16/15 0405 09/17/15 0400 09/18/15 0433 09/19/15 0411  WBC 13.1*  < > 11.1* 10.8* 10.9* 9.7 10.9*  NEUTROABS 10.9*  --   --   --   --   --   --   HGB 17.1*  < > 17.0 18.0* 16.6 17.0 16.1  HCT 49.6  < > 51.3 53.6* 49.7 51.0 48.3  MCV 91.0  < > 92.8 92.7 94.5 92.4 91.5  PLT 237  < > 190 202 190 184 216  < > = values in this interval not displayed. Cardiac Enzymes:  Recent Labs Lab 09/13/15 1650 09/13/15 2126 09/14/15 0302 09/14/15 0946  TROPONINI 0.12* 0.16* 0.16* 0.14*   BNP (last 3 results)  Recent Labs  09/13/15 1650  BNP 1302.1*    ProBNP (last 3 results) No results for input(s): PROBNP in the last 8760 hours.  CBG: No results for input(s): GLUCAP in the last 168 hours.  Recent Results (from the past 240 hour(s))  Blood culture (routine x 2)     Status: None   Collection Time: 09/13/15  8:11 PM  Result Value Ref Range Status   Specimen Description BLOOD LEFT HAND  Final   Special Requests BOTTLES DRAWN AEROBIC AND ANAEROBIC 5CC  Final   Culture   Final    NO GROWTH 5 DAYS Performed at [facility]    Report Status 09/18/2015 FINAL  Final  MRSA PCR Screening     Status: None   Collection Time: 09/13/15  9:08 PM  Result Value Ref Range Status    MRSA by PCR NEGATIVE NEGATIVE Final    Comment:        The GeneXpert MRSA Assay (FDA approved for NASAL specimens only), is one component of a comprehensive MRSA colonization surveillance program. It is not intended to diagnose MRSA infection nor to guide or monitor treatment for MRSA infections.   Blood culture (routine x 2)     Status: None (Preliminary result)   Collection Time: 09/13/15  9:26 PM  Result Value Ref Range Status   Specimen Description BLOOD LEFT HAND  Final   Special Requests BOTTLES DRAWN AEROBIC AND ANAEROBIC 5CC  Final   Culture   Final    NO GROWTH 4 DAYS Performed at [facility]    Report Status PENDING  Incomplete     Studies: No results found.  Scheduled Meds: .  antiseptic oral rinse  7 mL Mouth Rinse BID  . carvedilol  12.5 mg Oral BID WC  . clotrimazole   Topical BID  . folic acid  1 mg Oral Daily  . furosemide  40 mg Oral Daily  . lisinopril  10 mg Oral Daily  . LORazepam  0-4 mg Intravenous Q12H  . multivitamin with minerals  1 tablet Oral Daily  . rivaroxaban  20 mg Oral Q supper  . sodium chloride  3 mL Intravenous Q12H  . thiamine  100 mg Oral Daily   Or  . thiamine  100 mg Intravenous Daily   Continuous Infusions:    Principal Problem:   Severe sepsis with acute organ dysfunction (HCC) Active Problems:   Encephalopathy acute   AKI (acute kidney injury) (HCC)   Atrial fibrillation (HCC)   NSTEMI (non-ST elevated myocardial infarction) (HCC)   Acute combined systolic and diastolic congestive heart failure (HCC)   CAP (community acquired pneumonia)   Acute respiratory failure with hypoxia (HCC)   Malnutrition of moderate degree (HCC)   Alcohol withdrawal (HCC)   Atrial fibrillation, unspecified    Time spent: 25 min    Jeralyn Bennett  [facility] Pager [phone]. If 7PM-7AM, please contact night-coverage at [email or web address], password [facility] 09/19/2015, 12:33 PM  LOS: 6 days

## 2015-09-20 ENCOUNTER — Telehealth: Payer: Self-pay | Admitting: Cardiology

## 2015-09-20 MED ORDER — CARVEDILOL 12.5 MG PO TABS: 12.5000 mg | ORAL_TABLET | Freq: Two times a day (BID) | ORAL | Status: DC

## 2015-09-20 MED ORDER — RIVAROXABAN 20 MG PO TABS: 20.0000 mg | ORAL_TABLET | Freq: Every day | ORAL | Status: DC

## 2015-09-20 MED ORDER — FUROSEMIDE 40 MG PO TABS: 40.0000 mg | ORAL_TABLET | Freq: Every day | ORAL | Status: DC

## 2015-09-20 MED ORDER — LISINOPRIL 10 MG PO TABS: 10.0000 mg | ORAL_TABLET | Freq: Every day | ORAL | Status: DC

## 2015-09-20 NOTE — Progress Notes (Signed)
CSW received consult for New SNF.  CSW spoke with pt son and pt ex-wife.   Pt family agreeable to SNF in [place].   CSW initiated SNF search.  CSW followed up with pt son and pt ex wife at bedside.   CSW provided SNF bed offers.  Pt family chooses [facility] and [address].  CSW contacted [facility] and facility confirmed bed availability for today.  CSW to facilitate pt discharge needs to [facility].  Loletta Specter, MSW, LCSW Clinical Social Work [phone]

## 2015-09-20 NOTE — Discharge Instructions (Signed)
Information on my medicine - XARELTO (Rivaroxaban)  This medication education was reviewed with me or my healthcare representative as part of my discharge preparation.  The pharmacist that spoke with me during my hospital stay was:  Berkley Harvey, [facility]  Why was Xarelto prescribed for you? Xarelto was prescribed for you to reduce the risk of a blood clot forming that can cause a stroke if you have a medical condition called atrial fibrillation (a type of irregular heartbeat).  What do you need to know about xarelto ? Take your Xarelto ONCE DAILY at the same time every day with your evening meal. If you have difficulty swallowing the tablet whole, you may crush it and mix in applesauce just prior to taking your dose.  Take Xarelto exactly as prescribed by your doctor and DO NOT stop taking Xarelto without talking to the doctor who prescribed the medication.  Stopping without other stroke prevention medication to take the place of Xarelto may increase your risk of developing a clot that causes a stroke.  Refill your prescription before you run out.  After discharge, you should have regular check-up appointments with your healthcare provider that is prescribing your Xarelto.  In the future your dose may need to be changed if your kidney function or weight changes by a significant amount.  What do you do if you miss a dose? If you are taking Xarelto ONCE DAILY and you miss a dose, take it as soon as you remember on the same day then continue your regularly scheduled once daily regimen the next day. Do not take two doses of Xarelto at the same time or on the same day.   Important Safety Information A possible side effect of Xarelto is bleeding. You should call your healthcare provider right away if you experience any of the following: ? Bleeding from an injury or your nose that does not stop. ? Unusual colored urine (red or dark brown) or unusual colored stools (red or  black). ? Unusual bruising for unknown reasons. ? A serious fall or if you hit your head (even if there is no bleeding).  Some medicines may interact with Xarelto and might increase your risk of bleeding while on Xarelto. To help avoid this, consult your healthcare provider or pharmacist prior to using any new prescription or non-prescription medications, including herbals, vitamins, non-steroidal anti-inflammatory drugs (NSAIDs) and supplements.  This website has more information on Xarelto: [email or web address].br.

## 2015-09-20 NOTE — Progress Notes (Signed)
Speech Language Pathology Treatment: Dysphagia  Patient Details Name: Mark Mullen MRN: [id number] DOB: 1935-12-03 Today's Date: 09/20/2015 Time: 1052-1100 SLP Time Calculation (min) (ACUTE ONLY): 8 min  Assessment / Plan / Recommendation Clinical Impression  Pt up in chair, confusion significantly less than on evaluation. Mild delayed throat clears during part of breakfast observation. No concern for overt aspiration. Mild verbal reminders for small sips and bites. Functional oral phase. Recommend upgrade to regular texture, continue thin, no follow up ST needed. Possible discharge today (?). He reports he "thinks sons will be able to stay with him awhile". SLP in agreement due to observable memory difficulties.    HPI Other Pertinent Information: 80 yo male admitted volume overload and sepsis secondary to PNA, probable alcohol withdrawal. No known PMH. RN noted wet vocal quality with medication administrations, and pt was observed to have a suspected aspiration event 9/30 during breakfast meal, with drop in SpO2 to the 70s and RN providing oral suctioning to remove food from pt's mouth.   Pertinent Vitals Pain Assessment: No/denies pain  SLP Plan  All goals met;Discharge SLP treatment due to (comment)    Recommendations Diet recommendations: Regular;Thin liquid Liquids provided via: Cup Medication Administration: Whole meds with puree Supervision: Patient able to self feed Compensations: Slow rate;Small sips/bites Postural Changes and/or Swallow Maneuvers: Seated upright 90 degrees              Oral Care Recommendations: Oral care BID Follow up Recommendations: None Plan: All goals met;Discharge SLP treatment due to (comment)    GO     Houston Siren 09/20/2015, 11:08 AM  Orbie Pyo Colvin Caroli[employer] [phone]

## 2015-09-20 NOTE — Telephone Encounter (Signed)
D/C phone call . appt is on 10/08/15 at 10am w/ Corine Shelter at the [facility] .Marland Kitchen  Thanks

## 2015-09-20 NOTE — Progress Notes (Signed)
Patient ID: Mark Mullen, male   DOB: 1935/08/20, 80 y.o.   MRN: [id number]    Subjective:  Denies SSCP, palpitations or Dyspnea   Objective:  Filed Vitals:   09/19/15 0729 09/19/15 1336 09/19/15 2011 09/20/15 0448  BP: 148/88 139/78 121/71 147/84  Pulse: 101 107 78 64  Temp:  98.8 F (37.1 C) 97.8 F (36.6 C) 98.7 F (37.1 C)  TempSrc:  Oral Oral Oral  Resp: Height:      Weight:    68.402 kg (150 lb 12.8 oz)  SpO2: 96% 96% 97% 99%    Intake/Output from previous day:  Intake/Output Summary (Last 24 hours) at 09/20/15 4098 Last data filed at 09/20/15 1191  Gross per 24 hour  Intake    240 ml  Output   2300 ml  Net  -2060 ml    Physical Exam: Affect appropriate Healthy:  appears stated age HEENT: normal Neck supple with no adenopathy JVP normal no bruits no thyromegaly Lungs basilar crackles  no wheezing and good diaphragmatic motion Heart:  S1/S2 no murmur, no rub, gallop or click PMI normal Abdomen: benighn, BS positve, no tenderness, no AAA no bruit.  No HSM or HJR Distal pulses intact with no bruits No edema Neuro non-focal Skin warm and dry No muscular weakness   Lab Results: Basic Metabolic Panel:  Recent Labs  47/82/95 0433 09/19/15 0411  NA 132* 134*  K 5.2* 3.8  CL 92* 91*  CO2 33* 36*  GLUCOSE 125* 130*  BUN 19 19  CREATININE 0.97 1.15  CALCIUM 8.9 9.0   CBC:  Recent Labs  09/18/15 0433 09/19/15 0411  WBC 9.7 10.9*  HGB 17.0 16.1  HCT 51.0 48.3  MCV 92.4 91.5  PLT 184 216    Imaging: No results found.  Cardiac Studies:  ECG:  afib RBBB diffuse ST changes    Telemetry: not on telemetry   Echo:  9/27  EF 30-35%   Medications:   . antiseptic oral rinse  7 mL Mouth Rinse BID  . carvedilol  12.5 mg Oral BID WC  . clotrimazole   Topical BID  . folic acid  1 mg Oral Daily  . furosemide  40 mg Oral Daily  . lisinopril  10 mg Oral Daily  . multivitamin with minerals  1 tablet Oral Daily  . rivaroxaban  20 mg  Oral Q supper  . sodium chloride  3 mL Intravenous Q12H  . thiamine  100 mg Oral Daily   Or  . thiamine  100 mg Intravenous Daily       Assessment/Plan:  1. Atrial fibrillation: SR but with frequent PVCs and occasional couplets. Rate is controlled in the 70s. BP is stable. Continue BB therapy for rate control Coreg increased to 12.5 mg BID . CHA2DS2VASc is 3. Xarelto started.   2. Elevated troponin: Flat, low level trend. Could be due to demand ischemia with acute illness in combination with PAF vs. NSTEMI.2D echo shows reduced LV systolic function. No prior study to compare. Will need at least a lexiscan myoview to rule out ischemia as outpt.   3. Cardiomyopathy: Dilated. EF 30-35%. Possibly related to acute illness. Continue medical management. Currently Coreg and lisinopril.    4. 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 Net negative 3.2L since admit. Renal function and electrolytes are stable. Continue lasix, 40 PO daily.  5. CKD: Stage 3.Scr. Improved, down from 1.27-->1.10-->0.97-->1.15.   6. Hypoxia: continues now  on nasal cannula per IM  7. Hyponatremia - improved  8. Sepsis and CAP: per IM.  Will arrange outpatient f/u   Charlton Haws 09/20/2015, 8:28 AM

## 2015-09-20 NOTE — Progress Notes (Signed)
Physical Therapy Treatment Patient Details Name: Mark Mullen MRN: [id number] DOB: 11/18/1935 Today's Date: 09/20/2015    History of Present Illness Evidently he is somewhat reclusive, but in the last week family have been unable to reach him by phone, and yesterday sent a neighbor to check on him. He was appeared tired and sluggish, but sent the neighbor away. 9/26  there was enough concern that EMS were called and found the patient confused, lethargic and hypoxic, Afib. on 9/30 patient had an episode of aspiration.    PT Comments    Patient is much improved today in Mobility, still confused as to orientation delayed F/C.Marland Kitchen  Recommend SNF.   Follow Up Recommendations  SNF;Supervision/Assistance - 24 hour     Equipment Recommendations  None recommended by PT    Recommendations for Other Services       Precautions / Restrictions Precautions Precautions: Fall Precaution Comments: incontinence    Mobility  Bed Mobility Overal bed mobility: Needs Assistance Bed Mobility: Supine to Sit     Supine to sit: Min assist     General bed mobility comments: extra time to mobilize, delayed  Transfers Overall transfer level: Needs assistance Equipment used: Rolling walker (2 wheeled) Transfers: Sit to/from Stand Sit to Stand: Mod assist;+2 safety/equipment Stand pivot transfers: Mod assist       General transfer comment: extra time, did stand from bed and BSC, cues for hand placement.  Ambulation/Gait Ambulation/Gait assistance: Mod assist;+2 safety/equipment Ambulation Distance (Feet): 420 Feet Assistive device: Rolling walker (2 wheeled) Gait Pattern/deviations: Trunk flexed;Shuffle;Decreased stride length Gait velocity: decreased   General Gait Details: frequent  episodes of veering to the R, assist with RW to keep in straight line. slow speed.   Stairs            Wheelchair Mobility    Modified Rankin (Stroke Patients Only)       Balance Overall  balance assessment: Needs assistance;History of Falls Sitting-balance support: Feet supported;No upper extremity supported Sitting balance-Leahy Scale: Fair     Standing balance support: During functional activity;Bilateral upper extremity supported Standing balance-Leahy Scale: Poor                      Cognition Arousal/Alertness: Awake/alert   Overall Cognitive Status: Impaired/Different from baseline Area of Impairment: Orientation;Attention;Memory;Following commands;Safety/judgement;Awareness Orientation Level: Disoriented to;Place;Time;Situation   Memory: Decreased recall of precautions;Decreased short-term memory Following Commands: Follows one step commands with increased time;Follows one step commands inconsistently Safety/Judgement: Decreased awareness of safety;Decreased awareness of deficits          Exercises      General Comments        Pertinent Vitals/Pain Pain Assessment: No/denies pain Faces Pain Scale: Hurts little more Pain Location: urinating Pain Descriptors / Indicators: Burning Pain Intervention(s): Monitored during session    Home Living                      Prior Function            PT Goals (current goals can now be found in the care plan section) Progress towards PT goals: Progressing toward goals    Frequency  Min 3X/week    PT Plan Current plan remains appropriate    Co-evaluation             End of Session   Activity Tolerance: Patient tolerated treatment well Patient left: in chair;with chair alarm set;with call bell/phone within reach     Time: 0935-1000  PT Time Calculation (min) (ACUTE ONLY): 25 min  Charges:  $Gait Training: 8-22 mins $Self Care/Home Management: 8-22                    G Codes:      Rada Hay 09/20/2015, 12:26 PM Blanchard Kelch PT [phone]

## 2015-09-20 NOTE — Progress Notes (Signed)
Patient medically stable for discharge to [facility] and [facility]. CSW informed pt son, Harlowe Varriale who will meet patient at facility. Pt to be transported by ptar. No further Clinical Social Work needs, signing off.   Olga Coaster, LCSW  Clinical Social Work  [employer] [phone]

## 2015-09-20 NOTE — Progress Notes (Signed)
Report called to Luster Landsberg, Charity fundraiser at [facility].

## 2015-09-20 NOTE — Discharge Summary (Signed)
Physician Discharge Summary  Mark Mullen EAV:[id number] DOB: 12/31/1934 DOA: 09/13/2015  PCP: No primary care provider on file.  Admit date: 09/13/2015 Discharge date: 09/20/2015  Time spent: 35 minutes  Recommendations for Outpatient Follow-up:  1. Please follow up on volume status, he was treated for acute CHF during this hospitalization, discharged on Lasix 2. Follow up on blood pressures, he was started on Lisinopril, Coreg and lasix during this hospitalization 3. Patient will need cardiology follow up for Myoview  Discharge Diagnoses:  Principal Problem:   Severe sepsis with acute organ dysfunction [facility]) Active Problems:   Encephalopathy acute   AKI (acute kidney injury) (HCC)   Atrial fibrillation (HCC)   NSTEMI (non-ST elevated myocardial infarction) (HCC)   Acute combined systolic and diastolic congestive heart failure (HCC)   CAP (community acquired pneumonia)   Acute respiratory failure with hypoxia (HCC)   Malnutrition of moderate degree (HCC)   Alcohol withdrawal (HCC)   Atrial fibrillation, unspecified   Discharge Condition: Stable  Diet recommendation: Heart Healthy  Filed Weights   09/18/15 0634 09/19/15 0539 09/20/15 0448  Weight: 68.402 kg (150 lb 12.8 oz) 68.992 kg (152 lb 1.6 oz) 68.402 kg (150 lb 12.8 oz)    History of present illness:  Mark Mullen is a 80 y.o. male with no known past medical history who presents with 1 week malaise, weakness.  History is collected from the patient's ex-wife, Mark Mullen (see demographics) as the patient is unable to provide reliable history. Evidently he is somewhat reclusive, but in the last week family have been unable to reach him by phone, and yesterday sent a neighbor to check on him. He was appeared tired and sluggish, but sent the neighbor away. Today, there was enough concern that EMS were called and found the patient confused, lethargic and hypoxic.   In the ED, the patient was in atrial fibrillation with  adequate BP. He appeared fluid overloaded and had a LLL infiltrate. He was acidotic to 7.29 with hypercarbia and required >6L O2 to maintain adequate oxygen saturation. He was given bronchodilators, furosemide, and started on CTX and azithromycin for CAP and sepsis and was admitted to [facility] in the step-down unit.   Hospital Course:  Mark Mullen is a pleasant 80 year old gentleman with a past medical history of alcohol abuse, admitted to the medicine service on 09/13/2015 when he presented with a significant functional decline, found to be confused, disoriented, lethargic. In the emergency department he was found to be in acute respiratory failure requiring NPPV as well as atrial fibrillation and having findings of acute CHF on physical examination. A portable chest x-ray revealed suspicion of left lower lobe airspace disease consistent with pneumonia. Transthoracic echocardiogram revealed an ejection fraction of 30-35% with diffuse hypokinesis as well as grade 2 diastolic dysfunction. Cardiology was consulted. He was started on IV heparin given risk for thromboembolism in setting of atrial fibrillation. I suspect that atrial fibrillation with rapid ventricular response may have precipitated acute decompensated congestive heart failure.   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 -He presented with acute hypoxemic respiratory failure requiring NPPV, having physical exam findings suggestive of CHF. -Atrial fibrillation with rapid ventricular response likely precipitating acute CHF -A transthoracic echocardiogram performed on 09/14/2015 revealed an ejection fraction of 30-35% with diffuse hypokinesis and grade 2 diastolic dysfunction. -Continue Lasix 40 mg by mouth daily.  -ACE inhibitor therapy and beta blocker would be beneficial in context of LV dysfunction. Lisinopril 10 mg by mouth daily and increase Coreg  to 12.5 mg by mouth twice a day  -Cardiomyopathy could be secondary to  chronic alcoholism vs ischemia.  -Will discharge on lasix at 40 mg PO q daily  2. Alcohol Withdrawal Delirium -Patient showing sniffing clinical improvement on this morning's examination he is more awake and alert, will follow commands -Improved, he was ambulated down the hallway  -Will discharge to SNF  3. Atrial fibrillation with CHADSVasc score of 3.  -Suspect precipitated by car to myopathy and alcohol abuse/withdrawal -He was initially placed on IV heparin -Now transitioned to Xarelto -Will follow up with cardiology  4. Acute hypercarbic respiratory failure -Initial ABG showed a PCO2 of 60.7 with a pH of 7.29. -Likely secondary to combination of acute decompensated heart failure and community acquire pneumonia. -He initially required NPPV -Currently stabilizing a respiratory standpoint   5. Community-acquired pneumonia. -Will stop IV antibiotic therapy.  -He has received 7 days of antimicrobial therapy.   6. Urinary retention. -Likely secondary to IV sedatives he has received for alcohol withdrawal. -Bladder scan yesterday revealed residual of 600 mL of urine, which will catheter was placed. -Foley discontinued   Consultations:  Cardiology  Discharge Exam: Filed Vitals:   09/20/15 0448  BP: 147/84  Pulse: 64  Temp: 98.7 F (37.1 C)  Resp: 16    General: Remains weak, needs help getting out of bed.   Cardiovascular: Irregular rate and rhythm normal S1-S2 no murmurs rubs or gallops  Respiratory: Good airmovement  Abdomen: Soft nontender nondistended  Musculoskeletal: no edema, he has generalized weakness.  Discharge Instructions   Discharge Instructions    (HEART FAILURE PATIENTS) Call MD:  Anytime you have any of the following symptoms: 1) 3 pound weight gain in 24 hours or 5 pounds in 1 week 2) shortness of breath, with or without a dry hacking cough 3) swelling in the hands, feet or stomach 4) if you have to sleep on extra pillows at night in  order to breathe.    Complete by:  As directed      Call MD for:  difficulty breathing, headache or visual disturbances    Complete by:  As directed      Call MD for:  extreme fatigue    Complete by:  As directed      Call MD for:  hives    Complete by:  As directed      Call MD for:  persistant dizziness or light-headedness    Complete by:  As directed      Call MD for:  persistant nausea and vomiting    Complete by:  As directed      Call MD for:  redness, tenderness, or signs of infection (pain, swelling, redness, odor or green/yellow discharge around incision site)    Complete by:  As directed      Call MD for:  severe uncontrolled pain    Complete by:  As directed      Call MD for:  temperature >100.4    Complete by:  As directed      Call MD for:    Complete by:  As directed      Diet - low sodium heart healthy    Complete by:  As directed      Increase activity slowly    Complete by:  As directed           Current Discharge Medication List    START taking these medications   Details  carvedilol (COREG) 12.5 MG tablet  Take 1 tablet (12.5 mg total) by mouth 2 (two) times daily with a meal. Qty: 60 tablet, Refills: 0    furosemide (LASIX) 40 MG tablet Take 1 tablet (40 mg total) by mouth daily. Qty: 30 tablet, Refills: 0    lisinopril (PRINIVIL,ZESTRIL) 10 MG tablet Take 1 tablet (10 mg total) by mouth daily. Qty: 30 tablet, Refills: 0    rivaroxaban (XARELTO) 20 MG TABS tablet Take 1 tablet (20 mg total) by mouth daily with supper. Qty: 30 tablet, Refills: 0      CONTINUE these medications which have NOT CHANGED   Details  Ascorbic Acid (VITAMIN C PO) Take by mouth daily.    Cholecalciferol (VITAMIN D PO) Take by mouth daily.      STOP taking these medications     OVER THE COUNTER MEDICATION      VITAMIN E PO        No Known Allergies Follow-up Information    Follow up with Rollene Rotunda, MD In 2 weeks.   Specialty:  Cardiology   Contact  information:   [address] [place] [address] [phone]        The results of significant diagnostics from this hospitalization (including imaging, microbiology, ancillary and laboratory) are listed below for reference.    Significant Diagnostic Studies: Ct Head Wo Contrast  09/13/2015   CLINICAL DATA:  Slurred speech.  EXAM: CT HEAD WITHOUT CONTRAST  TECHNIQUE: Contiguous axial images were obtained from the base of the skull through the vertex without intravenous contrast.  COMPARISON:  None.  FINDINGS: The brainstem, cerebellum, cerebral peduncles, thalamus, basal ganglia, basilar cisterns, and ventricular system appear within normal limits. Periventricular white matter and corona radiata hypodensities favor chronic ischemic microvascular white matter disease. No intracranial hemorrhage, mass lesion, or acute CVA.  Chronic ethmoid and left maxillary sinusitis.  IMPRESSION: 1. No acute intracranial findings. 2. Periventricular white matter and corona radiata hypodensities favor chronic ischemic microvascular white matter disease. 3. Chronic ethmoid and left maxillary sinusitis.   Electronically Signed   By: Gaylyn Rong M.D.   On: 09/13/2015 17:12   Dg Chest Portable 1 View  09/13/2015   CLINICAL DATA:  Dyspnea.  Fatigue.  Altered speech.  EXAM: PORTABLE CHEST 1 VIEW  COMPARISON:  None.  FINDINGS: Midline trachea. Normal heart size for level of inspiration. Probable small left pleural effusion. No pneumothorax. Patchy left base opacities, obscuring the left hemidiaphragm.  IMPRESSION: Suspicion of left lower lobe airspace disease/ pneumonia with small left pleural effusion. Consider PA and lateral radiographs for further evaluation.   Electronically Signed   By: Jeronimo Greaves M.D.   On: 09/13/2015 17:21    Microbiology: Recent Results (from the past 240 hour(s))  Blood culture (routine x 2)     Status: None   Collection Time: 09/13/15  8:11 PM  Result Value Ref Range  Status   Specimen Description BLOOD LEFT HAND  Final   Special Requests BOTTLES DRAWN AEROBIC AND ANAEROBIC 5CC  Final   Culture   Final    NO GROWTH 5 DAYS Performed at [facility]    Report Status 09/18/2015 FINAL  Final  MRSA PCR Screening     Status: None   Collection Time: 09/13/15  9:08 PM  Result Value Ref Range Status   MRSA by PCR NEGATIVE NEGATIVE Final    Comment:        The GeneXpert MRSA Assay (FDA approved for NASAL specimens only), is one component of  a comprehensive MRSA colonization surveillance program. It is not intended to diagnose MRSA infection nor to guide or monitor treatment for MRSA infections.   Blood culture (routine x 2)     Status: None   Collection Time: 09/13/15  9:26 PM  Result Value Ref Range Status   Specimen Description BLOOD LEFT HAND  Final   Special Requests BOTTLES DRAWN AEROBIC AND ANAEROBIC 5CC  Final   Culture   Final    NO GROWTH 5 DAYS Performed at [facility]    Report Status 09/19/2015 FINAL  Final     Labs: Basic Metabolic Panel:  Recent Labs Lab 09/13/15 1650  09/15/15 0350 09/16/15 0405 09/17/15 0400 09/18/15 0433 09/19/15 0411  NA 131*  < > 132* 135 137 132* 134*  K 4.8  < > 3.9 4.1 4.3 5.2* 3.8  CL 95*  < > 94* 91* 95* 92* 91*  CO2 26  < > 30 35* 33* 33* 36*  GLUCOSE 142*  < > 126* 126* 123* 125* 130*  BUN 25*  < > 21* 19 16 19 19   CREATININE 1.30*  < > 1.27* 1.10 0.92 0.97 1.15  CALCIUM 9.2  < > 8.5* 8.9 9.0 8.9 9.0  MG 2.0  --   --   --   --   --   --   < > = values in this interval not displayed. Liver Function Tests: No results for input(s): AST, ALT, ALKPHOS, BILITOT, PROT, ALBUMIN in the last 168 hours. No results for input(s): LIPASE, AMYLASE in the last 168 hours. No results for input(s): AMMONIA in the last 168 hours. CBC:  Recent Labs Lab 09/13/15 1650  09/15/15 0350 09/16/15 0405 09/17/15 0400 09/18/15 0433 09/19/15 0411  WBC 13.1*  < > 11.1* 10.8* 10.9* 9.7 10.9*   NEUTROABS 10.9*  --   --   --   --   --   --   HGB 17.1*  < > 17.0 18.0* 16.6 17.0 16.1  HCT 49.6  < > 51.3 53.6* 49.7 51.0 48.3  MCV 91.0  < > 92.8 92.7 94.5 92.4 91.5  PLT 237  < > 190 202 190 184 216  < > = values in this interval not displayed. Cardiac Enzymes:  Recent Labs Lab 09/13/15 1650 09/13/15 2126 09/14/15 0302 09/14/15 0946  TROPONINI 0.12* 0.16* 0.16* 0.14*   BNP: BNP (last 3 results)  Recent Labs  09/13/15 1650  BNP 1302.1*    ProBNP (last 3 results) No results for input(s): PROBNP in the last 8760 hours.  CBG: No results for input(s): GLUCAP in the last 168 hours.     SignedJeralyn Bennett  [facility] 09/20/2015, 9:18 AM

## 2015-09-21 NOTE — Telephone Encounter (Signed)
Spoke to nurse Hale Bogus, she is taking of patient today. Nurse Hale Bogus contacted regarding discharge from [facility] on 09/20/15.  Patient understands to follow up with provider Corine Shelter on 10/08/15 at 10 am  at [facility]. Patient understands discharge instructions? yes  Patient understands medications and regiment? yes Patient understands to bring all medications to this visit? yes

## 2015-09-23 ENCOUNTER — Non-Acute Institutional Stay (SKILLED_NURSING_FACILITY): Payer: Medicare Other | Admitting: Internal Medicine

## 2015-09-23 DIAGNOSIS — F10231 Alcohol dependence with withdrawal delirium: Secondary | ICD-10-CM

## 2015-09-23 DIAGNOSIS — J189 Pneumonia, unspecified organism: Secondary | ICD-10-CM

## 2015-09-23 DIAGNOSIS — J9602 Acute respiratory failure with hypercapnia: Secondary | ICD-10-CM | POA: Diagnosis not present

## 2015-09-23 DIAGNOSIS — E559 Vitamin D deficiency, unspecified: Secondary | ICD-10-CM | POA: Diagnosis not present

## 2015-09-23 DIAGNOSIS — N179 Acute kidney failure, unspecified: Secondary | ICD-10-CM

## 2015-09-23 DIAGNOSIS — I480 Paroxysmal atrial fibrillation: Secondary | ICD-10-CM

## 2015-09-23 DIAGNOSIS — F10931 Alcohol use, unspecified with withdrawal delirium: Secondary | ICD-10-CM

## 2015-09-23 DIAGNOSIS — J9601 Acute respiratory failure with hypoxia: Secondary | ICD-10-CM | POA: Diagnosis not present

## 2015-09-23 DIAGNOSIS — I110 Hypertensive heart disease with heart failure: Secondary | ICD-10-CM

## 2015-09-23 DIAGNOSIS — I5041 Acute combined systolic (congestive) and diastolic (congestive) heart failure: Secondary | ICD-10-CM | POA: Diagnosis not present

## 2015-09-23 DIAGNOSIS — B372 Candidiasis of skin and nail: Secondary | ICD-10-CM | POA: Diagnosis not present

## 2015-09-23 NOTE — Progress Notes (Signed)
MRN: [id number] Name: Mark Mullen  Sex: male Age: 80 y.o. DOB: Sep 07, 1935  PSC #: Sonny Dandy Facility/Room:213 Level Of Care: SNF Provider: Merrilee Seashore D Emergency Contacts: Extended Emergency Contact Information Primary Emergency Contact: [place],Samual Barnabas Lister [place] Mobile Phone: [phone] Relation: Son Secondary Emergency Contact: Nestor Lewandowsky [place] Home Phone: [phone] Mobile Phone: [phone] Relation: Other  Code Status:   Allergies: Review of patient's allergies indicates no known allergies.  Chief Complaint  Patient presents with  . New Admit To SNF    HPI: Patient is 80 y.o. male who was admitted to the medicine service on 09/13/2015 when he presented with a significant functional decline, found to be confused, disoriented, lethargic. Pt was hospitalized from 9/26 - 10/3 where he was found to have acute respiratory failure from acute CHF, sepsis from  a LLL PNA and AF with RVR. Hospitalization was complicated by ETOH withdrawal and urinary retention. Pt is admitted to SNF for generalized weakness for OT/PT. While at SNF pt will be followed for vit D deficiency, tx with supplementation, HTN, tx with Coreg, lisinopril and lasix and  atrial fib, tx with coreg and xarelto.  History reviewed. No pertinent past medical history.- both pt and family state he has never been sick  Past Surgical History  Procedure Laterality Date  . Tonsillectomy        Medication List       This list is accurate as of: 09/23/15 11:59 PM.  Always use your most recent med list.               carvedilol 12.5 MG tablet  Commonly known as:  COREG  Take 1 tablet (12.5 mg total) by mouth 2 (two) times daily with a meal.     furosemide 40 MG tablet  Commonly known as:  LASIX  Take 1 tablet (40 mg total) by mouth daily.     lisinopril 10 MG tablet  Commonly known as:  PRINIVIL,ZESTRIL  Take 1 tablet (10 mg total) by mouth daily.     rivaroxaban 20 MG Tabs tablet  Commonly known as:  XARELTO  Take 1 tablet (20 mg total) by mouth daily with supper.     VITAMIN C PO  Take by mouth daily.     VITAMIN D PO  Take by mouth daily.        No orders of the defined types were placed in this encounter.     There is no immunization history on file for this patient.  Social History  Substance Use Topics  . Smoking status: Current Every Day Smoker  . Smokeless tobacco: Not on file  . Alcohol Use: Yes     Comment: daily    Family history is + DM, brain CA  Review of Systems  DATA OBTAINED: from patient, nurse, sister GENERAL:  no fevers, fatigue, appetite changes SKIN: rash on buttocks EYES: No eye pain, redness, discharge EARS: No earache, tinnitus, change in hearing NOSE: No congestion, drainage or bleeding  MOUTH/THROAT: No mouth or tooth pain, No sore throat RESPIRATORY: No cough, wheezing, SOB CARDIAC: No chest pain, palpitations, some lower extremity edema  GI: No abdominal pain, No N/V/D or constipation, No heartburn or reflux  GU: + dysuria,+ frequency + urgency  MUSCULOSKELETAL: No unrelieved bone/joint pain NEUROLOGIC: No headache, dizziness or focal weakness PSYCHIATRIC: No c/o anxiety or sadness   Filed Vitals:   09/24/15 2116  BP: 152/76  Pulse: 69  Temp: 97.3 F (36.3 C)  Resp: 20    SpO2 Readings from Last 1 Encounters:  09/20/15 97%        Physical Exam  GENERAL APPEARANCE: Alert, conversant,  No acute distress.  SKIN: diaper dermatitis L buttocks  HEAD: Normocephalic, atraumatic  EYES: Conjunctiva/lids clear. Pupils round, reactive. EOMs intact.  EARS: External exam WNL, canals clear. Hearing grossly normal.  NOSE: No deformity or discharge.  MOUTH/THROAT: Lips w/o lesions  RESPIRATORY: Breathing is even, unlabored. Lung sounds are clear   CARDIOVASCULAR: Heart RRR no murmurs, rubs or gallops. 1/2+ peripheral edema.   GASTROINTESTINAL: Abdomen is soft, non-tender, not  distended w/ normal bowel sounds. GENITOURINARY: Bladder non tender, not distended  MUSCULOSKELETAL: No abnormal joints or musculature NEUROLOGIC:  Cranial nerves 2-12 grossly intact. Moves all extremities  PSYCHIATRIC: Mood and affect appropriate to situation, no behavioral issues  Patient Active Problem List   Diagnosis Date Noted  . Hypertensive heart disease with CHF (congestive heart failure) (HCC) 09/26/2015  . Vitamin D deficiency 09/26/2015  . Atrial fibrillation, unspecified   . Alcohol withdrawal (HCC)   . Malnutrition of moderate degree (HCC) 09/14/2015  . Encephalopathy acute 09/13/2015  . Severe sepsis with acute organ dysfunction (HCC) 09/13/2015  . AKI (acute kidney injury) (HCC) 09/13/2015  . Atrial fibrillation (HCC) 09/13/2015  . NSTEMI (non-ST elevated myocardial infarction) (HCC) 09/13/2015  . Acute combined systolic and diastolic congestive heart failure (HCC) 09/13/2015  . CAP (community acquired pneumonia) 09/13/2015  . Acute respiratory failure with hypoxia and hypercapnia (HCC) 09/13/2015    CBC    Component Value Date/Time   WBC 10.9* 09/19/2015 0411   RBC 5.28 09/19/2015 0411   HGB 16.1 09/19/2015 0411   HCT 48.3 09/19/2015 0411   PLT 216 09/19/2015 0411   MCV 91.5 09/19/2015 0411   LYMPHSABS 0.8 09/13/2015 1650   MONOABS 1.3* 09/13/2015 1650   EOSABS 0.0 09/13/2015 1650   BASOSABS 0.0 09/13/2015 1650    CMP     Component Value Date/Time   NA 134* 09/19/2015 0411   K 3.8 09/19/2015 0411   CL 91* 09/19/2015 0411   CO2 36* 09/19/2015 0411   GLUCOSE 130* 09/19/2015 0411   BUN 19 09/19/2015 0411   CREATININE 1.15 09/19/2015 0411   CALCIUM 9.0 09/19/2015 0411   GFRNONAA 60* 09/19/2015 0411   GFRAA >60 09/19/2015 0411    No results found for: HGBA1C   Ct Head Wo Contrast  09/13/2015   CLINICAL DATA:  Slurred speech.  EXAM: CT HEAD WITHOUT CONTRAST  TECHNIQUE: Contiguous axial images were obtained from the base of the skull through the  vertex without intravenous contrast.  COMPARISON:  None.  FINDINGS: The brainstem, cerebellum, cerebral peduncles, thalamus, basal ganglia, basilar cisterns, and ventricular system appear within normal limits. Periventricular white matter and corona radiata hypodensities favor chronic ischemic microvascular white matter disease. No intracranial hemorrhage, mass lesion, or acute CVA.  Chronic ethmoid and left maxillary sinusitis.  IMPRESSION: 1. No acute intracranial findings. 2. Periventricular white matter and corona radiata hypodensities favor chronic ischemic microvascular white matter disease. 3. Chronic ethmoid and left maxillary sinusitis.   Electronically Signed   By: Gaylyn Rong M.D.   On: 09/13/2015 17:12   Dg Chest Portable 1 View  09/13/2015   CLINICAL DATA:  Dyspnea.  Fatigue.  Altered speech.  EXAM: PORTABLE CHEST 1 VIEW  COMPARISON:  None.  FINDINGS: Midline trachea. Normal heart size for level of inspiration. Probable small left pleural effusion. No pneumothorax. Patchy left base opacities, obscuring the  left hemidiaphragm.  IMPRESSION: Suspicion of left lower lobe airspace disease/ pneumonia with small left pleural effusion. Consider PA and lateral radiographs for further evaluation.   Electronically Signed   By: Jeronimo Greaves M.D.   On: 09/13/2015 17:21    Not all labs, radiology exams or other studies done during hospitalization come through on my EPIC note; however they are reviewed by me.    Assessment and Plan  Acute respiratory failure with hypoxia and hypercapnia (HCC) Initial ABG showed a PCO2 of 60.7 with a pH of 7.29. -Likely secondary to combination of acute decompensated heart failure and community acquire pneumonia. -He initially required NPPV SNF - monitor for potential respiratory decline  Acute combined systolic and diastolic congestive heart failure He presented with acute hypoxemic respiratory failure requiring NPPV, having physical exam findings suggestive  of CHF. -Atrial fibrillation with rapid ventricular response likely precipitating acute CHF -A transthoracic echocardiogram performed on 09/14/2015 revealed an ejection fraction of 30-35% with diffuse hypokinesis and grade 2 diastolic dysfunction. -Continue Lasix 40 mg by mouth daily.  -ACE inhibitor therapy and beta blocker would be beneficial in context of LV dysfunction. Lisinopril 10 mg by mouth daily and increase Coreg to 12.5 mg by mouth twice a day  -Cardiomyopathy could be secondary to chronic alcoholism vs ischemia.  SNF - continue  lasix at 40 mg PO q daily   Atrial fibrillation Suspect precipitated by cardiomyopathy and alcohol abuse/withdrawal -He was initially placed on IV heparin -Now transitioned to Xarelto SNF - cont coreg for rate and xarelto as prophylaxis; follow up with cardiology  CAP (community acquired pneumonia) He has received 7 days of IV antimicrobial therapy, considered treated  Alcohol withdrawal Patient showing sniffing clinical improvement on this morning's examination he is more awake and alert, will follow commands SNF -  Hypertensive heart disease with CHF (congestive heart failure) (HCC) SNF - cont coreg 12.5 BID, lisinopril 10 and lasix 40 mg  AKI (acute kidney injury) 2/2 sepsis, improved with tx; SNF - BMP to monitor  Vitamin D deficiency SNF - cont vit D 1000 u daily   Time spent 45 min;> 50% of time with patient was spent reviewing records, labs, tests and studies, counseling and developing plan of care  Margit Hanks, MD

## 2015-09-24 ENCOUNTER — Encounter: Payer: Self-pay | Admitting: Internal Medicine

## 2015-09-26 DIAGNOSIS — I110 Hypertensive heart disease with heart failure: Secondary | ICD-10-CM | POA: Insufficient documentation

## 2015-09-26 DIAGNOSIS — B372 Candidiasis of skin and nail: Secondary | ICD-10-CM | POA: Insufficient documentation

## 2015-09-26 DIAGNOSIS — E559 Vitamin D deficiency, unspecified: Secondary | ICD-10-CM | POA: Insufficient documentation

## 2015-09-26 NOTE — Assessment & Plan Note (Signed)
2/2 sepsis, improved with tx; SNF - BMP to monitor

## 2015-09-26 NOTE — Assessment & Plan Note (Signed)
SNF - cont coreg 12.5 BID, lisinopril 10 and lasix 40 mg

## 2015-09-26 NOTE — Assessment & Plan Note (Signed)
He presented with acute hypoxemic respiratory failure requiring NPPV, having physical exam findings suggestive of CHF. -Atrial fibrillation with rapid ventricular response likely precipitating acute CHF -A transthoracic echocardiogram performed on 09/14/2015 revealed an ejection fraction of 30-35% with diffuse hypokinesis and grade 2 diastolic dysfunction. -Continue Lasix 40 mg by mouth daily.  -ACE inhibitor therapy and beta blocker would be beneficial in context of LV dysfunction. Lisinopril 10 mg by mouth daily and increase Coreg to 12.5 mg by mouth twice a day  -Cardiomyopathy could be secondary to chronic alcoholism vs ischemia.  SNF - continue  lasix at 40 mg PO q daily

## 2015-09-26 NOTE — Assessment & Plan Note (Signed)
He has received 7 days of IV antimicrobial therapy, considered treated

## 2015-09-26 NOTE — Assessment & Plan Note (Signed)
Patient showing sniffing clinical improvement on this morning's examination he is more awake and alert, will follow commands SNF -

## 2015-09-26 NOTE — Assessment & Plan Note (Signed)
Suspect precipitated by cardiomyopathy and alcohol abuse/withdrawal -He was initially placed on IV heparin -Now transitioned to Xarelto SNF - cont coreg for rate and xarelto as prophylaxis; follow up with cardiology

## 2015-09-26 NOTE — Assessment & Plan Note (Signed)
Initial ABG showed a PCO2 of 60.7 with a pH of 7.29. -Likely secondary to combination of acute decompensated heart failure and community acquire pneumonia. -He initially required NPPV SNF - monitor for potential respiratory decline

## 2015-09-26 NOTE — Assessment & Plan Note (Addendum)
SNF - cont vit D 1000 u daily 

## 2015-09-26 NOTE — Assessment & Plan Note (Signed)
SNF - clotrimazole cream, wound care nurse

## 2015-09-27 ENCOUNTER — Non-Acute Institutional Stay (SKILLED_NURSING_FACILITY): Payer: Medicare Other | Admitting: Internal Medicine

## 2015-09-27 ENCOUNTER — Telehealth: Payer: Self-pay | Admitting: Cardiology

## 2015-09-27 DIAGNOSIS — R21 Rash and other nonspecific skin eruption: Secondary | ICD-10-CM

## 2015-09-27 DIAGNOSIS — B372 Candidiasis of skin and nail: Secondary | ICD-10-CM | POA: Diagnosis not present

## 2015-09-27 DIAGNOSIS — R238 Other skin changes: Secondary | ICD-10-CM

## 2015-09-27 NOTE — Telephone Encounter (Signed)
New message  Pt c/o swelling: STAT is pt has developed SOB within 24 hours  1. How long have you been experiencing swelling? About 7 days   2. Where is the swelling located? Between Knee's ankles and feet. Bottom of feet is really swollen   3.  Are you currently taking a "fluid pill"?Yes   4.  Are you currently SOB? No Haven't noticed SOB. Urinates a lot. Limited to 8 ounces of liquids at meal time   5.  Have you traveled recently?No   Comments? No further comments.

## 2015-09-27 NOTE — Telephone Encounter (Signed)
No answer when dialed. 

## 2015-09-28 NOTE — Telephone Encounter (Signed)
Reviewed chart yesterday - this patient had recently been put in SNF.

## 2015-09-29 NOTE — Progress Notes (Signed)
MRN: [id number] Name: Mark Mullen  Sex: male Age: 80 y.o. DOB: 1935-08-10  [facility] #:[facility]  Facility/Room: Level Of Care: SNF Provider: Merrilee Seashore D Emergency Contacts: Extended Emergency Contact Information Primary Emergency Contact: Riera,Noelle Barnabas Lister [place] Mobile Phone: [phone] Relation: Son Secondary Emergency Contact: Nestor Lewandowsky [place] Home Phone: [phone] Mobile Phone: [phone] Relation: Other  Code Status:   Allergies: Review of patient's allergies indicates no known allergies.  Chief Complaint  Patient presents with  . Acute Visit    HPI: Patient is 80 y.o. male with CHF, AF, HTN and hx ETOH abuse who nursing asked me to see for GU rash worsening.   Past Medical History  Diagnosis Date  . CHF (congestive heart failure) (HCC)   . Hypertension   . Atrial fibrillation (HCC)   . Alcohol abuse     Past Surgical History  Procedure Laterality Date  . Tonsillectomy        Medication List       This list is accurate as of: 09/27/15 11:59 PM.  Always use your most recent med list.               carvedilol 12.5 MG tablet  Commonly known as:  COREG  Take 1 tablet (12.5 mg total) by mouth 2 (two) times daily with a meal.     furosemide 40 MG tablet  Commonly known as:  LASIX  Take 1 tablet (40 mg total) by mouth daily.     lisinopril 10 MG tablet  Commonly known as:  PRINIVIL,ZESTRIL  Take 1 tablet (10 mg total) by mouth daily.     rivaroxaban 20 MG Tabs tablet  Commonly known as:  XARELTO  Take 1 tablet (20 mg total) by mouth daily with supper.     VITAMIN C PO  Take by mouth daily.     VITAMIN D PO  Take by mouth daily.        No orders of the defined types were placed in this encounter.     There is no immunization history on file for this patient.  Social History  Substance Use Topics  . Smoking status: Current Every Day Smoker  . Smokeless tobacco: Not on file  . Alcohol Use:  Yes     Comment: daily    Review of Systems  DATA OBTAINED: from nurse, pt, pt's sister GENERAL:  no fevers, fatigue, appetite changes SKIN: rash in groin and on scrotum worsening HEENT: No complaint RESPIRATORY: No cough, wheezing, SOB CARDIAC: No chest pain, palpitations, lower extremity edema  GI: No abdominal pain, No N/V/D or constipation, No heartburn or reflux  GU: No dysuria, frequency or urgency, or incontinence  MUSCULOSKELETAL: No unrelieved bone/joint pain NEUROLOGIC: No headache, dizziness  PSYCHIATRIC: No overt anxiety or sadness  Filed Vitals:   10/02/15 1548  BP: 128/89  Pulse: 78  Temp: 97 F (36.1 C)  Resp: 20    Physical Exam  GENERAL APPEARANCE: Alert,  No acute distress  SKIN: dark pink macular rash in R groin and R scrotum; no swelling or heat; upper post thigh - grouping vessicles, and 2 scars distally of round groupings  HEENT: Unremarkable RESPIRATORY: Breathing is even, unlabored. Lung sounds are clear   CARDIOVASCULAR: Heart RRR no murmurs, rubs or gallops. No peripheral edema  GASTROINTESTINAL: Abdomen is soft, non-tender, not distended w/ normal bowel sounds.  GENITOURINARY: Bladder non tender, not distended  MUSCULOSKELETAL: No abnormal joints or musculature NEUROLOGIC: Cranial nerves 2-12  grossly intact. Moves all , pextremities PSYCHIATRIC: Mood and affect appropriate to situation, no behavioral issues  Patient Active Problem List   Diagnosis Date Noted  . Vesicular rash 10/02/2015  . Hypertensive heart disease with CHF (congestive heart failure) (HCC) 09/26/2015  . Vitamin D deficiency 09/26/2015  . Yeast dermatitis 09/26/2015  . Atrial fibrillation, unspecified   . Alcohol withdrawal (HCC)   . Malnutrition of moderate degree (HCC) 09/14/2015  . Encephalopathy acute 09/13/2015  . Severe sepsis with acute organ dysfunction (HCC) 09/13/2015  . AKI (acute kidney injury) (HCC) 09/13/2015  . Atrial fibrillation (HCC) 09/13/2015  .  NSTEMI (non-ST elevated myocardial infarction) (HCC) 09/13/2015  . Acute combined systolic and diastolic congestive heart failure (HCC) 09/13/2015  . CAP (community acquired pneumonia) 09/13/2015  . Acute respiratory failure with hypoxia and hypercapnia (HCC) 09/13/2015    CBC    Component Value Date/Time   WBC 10.9* 09/19/2015 0411   RBC 5.28 09/19/2015 0411   HGB 16.1 09/19/2015 0411   HCT 48.3 09/19/2015 0411   PLT 216 09/19/2015 0411   MCV 91.5 09/19/2015 0411   LYMPHSABS 0.8 09/13/2015 1650   MONOABS 1.3* 09/13/2015 1650   EOSABS 0.0 09/13/2015 1650   BASOSABS 0.0 09/13/2015 1650    CMP     Component Value Date/Time   NA 134* 09/19/2015 0411   K 3.8 09/19/2015 0411   CL 91* 09/19/2015 0411   CO2 36* 09/19/2015 0411   GLUCOSE 130* 09/19/2015 0411   BUN 19 09/19/2015 0411   CREATININE 1.15 09/19/2015 0411   CALCIUM 9.0 09/19/2015 0411   GFRNONAA 60* 09/19/2015 0411   GFRAA >60 09/19/2015 0411    Assessment and Plan  Yeast dermatitis Dermatitis much worse; Start diflucan 150 mg q 72 hours X 2 doses  Vesicular rash Herpetic appearing;could be genital herpes (no hx) or zoster; start valtrex 1000 mg q 8 for 7 days   Time spent 35 min Margit Hanks, MD

## 2015-10-02 ENCOUNTER — Encounter: Payer: Self-pay | Admitting: Internal Medicine

## 2015-10-02 DIAGNOSIS — R238 Other skin changes: Secondary | ICD-10-CM | POA: Insufficient documentation

## 2015-10-02 NOTE — Assessment & Plan Note (Addendum)
Dermatitis much worse; Start diflucan 150 mg q 72 hours X 2 doses; may be source of pt's dysuria-U/A is neg to date

## 2015-10-02 NOTE — Assessment & Plan Note (Signed)
Herpetic appearing;could be genital herpes (no hx) or zoster; start valtrex 1000 mg q 8 for 7 days

## 2015-10-08 ENCOUNTER — Encounter: Payer: Self-pay | Admitting: Cardiology

## 2015-10-08 ENCOUNTER — Ambulatory Visit (INDEPENDENT_AMBULATORY_CARE_PROVIDER_SITE_OTHER): Payer: Medicare Other | Admitting: Cardiology

## 2015-10-08 ENCOUNTER — Non-Acute Institutional Stay (SKILLED_NURSING_FACILITY): Payer: Medicare Other | Admitting: Nurse Practitioner

## 2015-10-08 ENCOUNTER — Encounter: Payer: Self-pay | Admitting: Nurse Practitioner

## 2015-10-08 VITALS — BP 118/64 | HR 50 | Ht 69.0 in | Wt 148.6 lb

## 2015-10-08 DIAGNOSIS — I5041 Acute combined systolic (congestive) and diastolic (congestive) heart failure: Secondary | ICD-10-CM

## 2015-10-08 DIAGNOSIS — N179 Acute kidney failure, unspecified: Secondary | ICD-10-CM | POA: Diagnosis not present

## 2015-10-08 DIAGNOSIS — I480 Paroxysmal atrial fibrillation: Secondary | ICD-10-CM

## 2015-10-08 DIAGNOSIS — I110 Hypertensive heart disease with heart failure: Secondary | ICD-10-CM | POA: Diagnosis not present

## 2015-10-08 DIAGNOSIS — I4510 Unspecified right bundle-branch block: Secondary | ICD-10-CM | POA: Diagnosis not present

## 2015-10-08 DIAGNOSIS — E440 Moderate protein-calorie malnutrition: Secondary | ICD-10-CM

## 2015-10-08 DIAGNOSIS — I429 Cardiomyopathy, unspecified: Secondary | ICD-10-CM | POA: Insufficient documentation

## 2015-10-08 DIAGNOSIS — J189 Pneumonia, unspecified organism: Secondary | ICD-10-CM | POA: Diagnosis not present

## 2015-10-08 DIAGNOSIS — F10231 Alcohol dependence with withdrawal delirium: Secondary | ICD-10-CM

## 2015-10-08 DIAGNOSIS — Z7901 Long term (current) use of anticoagulants: Secondary | ICD-10-CM

## 2015-10-08 DIAGNOSIS — Z79899 Other long term (current) drug therapy: Secondary | ICD-10-CM | POA: Diagnosis not present

## 2015-10-08 DIAGNOSIS — F10931 Alcohol use, unspecified with withdrawal delirium: Secondary | ICD-10-CM

## 2015-10-08 NOTE — Assessment & Plan Note (Signed)
Compensated 

## 2015-10-08 NOTE — Progress Notes (Signed)
10/08/2015 Mark Mullen   Jun 15, 1935  [id number]  Primary Physician No primary care provider on file. Primary Cardiologist: Dr Antoine Poche  HPI:  Pleasant 80 y/o male, resident of [address] former music (piano) professor at [employer], admitted 09/13/15 with acute respiratory failure, CAP, sepsis, acute renal insufficiency, CHF. He also had PAF, CHF, flat Troponin elevation, and cardiomyopathy on echo.  His EF was 30-35% with diffuse HK. He is in the office today for follow up. He denies any unusual dyspnea or chest pain. He did admit to me he has had chest discomfort "pressure" in the past. He says he feels his rehab is going well, he is able to ambulate without a walker.    Current Outpatient Prescriptions  Medication Sig Dispense Refill  . Ascorbic Acid (VITAMIN C PO) Take by mouth daily.    . carvedilol (COREG) 12.5 MG tablet Take 1 tablet (12.5 mg total) by mouth 2 (two) times daily with a meal. 60 tablet 0  . Cholecalciferol (VITAMIN D PO) Take by mouth daily.    Marland Kitchen CLOTRIMAZOLE-BETAMETHASONE EX Apply 1 application topically 2 (two) times daily.    . furosemide (LASIX) 40 MG tablet Take 1 tablet (40 mg total) by mouth daily. 30 tablet 0  . lisinopril (PRINIVIL,ZESTRIL) 10 MG tablet Take 1 tablet (10 mg total) by mouth daily. 30 tablet 0  . rivaroxaban (XARELTO) 20 MG TABS tablet Take 1 tablet (20 mg total) by mouth daily with supper. 30 tablet 0   No current facility-administered medications for this visit.    No Known Allergies  Social History   Social History  . Marital Status: Single    Spouse Name: N/A  . Number of Children: N/A  . Years of Education: N/A   Occupational History  . Not on file.   Social History Main Topics  . Smoking status: Current Every Day Smoker  . Smokeless tobacco: Not on file  . Alcohol Use: Yes     Comment: daily  . Drug Use: No  . Sexual Activity: Not on file   Other Topics Concern  . Not on file   Social History  Narrative     Review of Systems: General: negative for chills, fever, night sweats or weight changes.  Cardiovascular: negative for chest pain, dyspnea on exertion, edema, orthopnea, palpitations, paroxysmal nocturnal dyspnea or shortness of breath Dermatological: negative for rash Respiratory: negative for cough or wheezing Urologic: negative for hematuria Abdominal: negative for nausea, vomiting, diarrhea, bright red blood per rectum, melena, or hematemesis Neurologic: negative for visual changes, syncope, or dizziness All other systems reviewed and are otherwise negative except as noted above.    Blood pressure 118/64, pulse 50, height 5\' 9"  (1.753 m), weight 148 lb 9.6 oz (67.405 kg).  General appearance: alert, cooperative and no distress Neck: no carotid bruit and no JVD Lungs: clear to auscultation bilaterally Heart: regular rate and rhythm Extremities: no edema Skin: pale, cool, dry Neurologic: Grossly normal  EKG NSR, SB-51, RBBB, NSST changes  ASSESSMENT AND PLAN:   Cardiomyopathy - etiology not yet determined Suspected to be from acute sepsis or ETOH but will need to r/o ischemia  Acute combined systolic and diastolic congestive heart failure Compensated  PAF (paroxysmal atrial fibrillation) (HCC) CHADS VASc=3  Anticoagulated Xarelto  CAP (community acquired pneumonia) Recent hospitalization with sepsis, acute renal injury, CHF   PLAN  Mr Riepe appears to be recovering from his recenet illness. The etiology of his cardiomyopathy is not clear- ischemic, sepsis,  or ETOH (apparently a past history of ETOH abuse). I will arrange a Lexiscan Myoview and f/u with Dr Antoine Poche after that. I considered decreasing his Coreg secondary to bradycardia but he denied any dizziness, syncope, or near syncope. If his HR drops to less than 50 would decrease this to 6.25 mg BID. I also ordered a BMP to be drawn when he comes for his Myoview.   Ashea Winiarski K  PA-C 10/08/2015 10:21 AM

## 2015-10-08 NOTE — Patient Instructions (Signed)
Your physician recommends that you return for lab work the day that you have your lexiscan.  Your physician has requested that you have a lexiscan myoview. For further information please visit [email or web address] Please follow instruction sheet, as given.   Your physician recommends that you schedule a follow-up appointment after stress test with Dr. Antoine Poche.  If you need a refill on your cardiac medications before your next appointment, please call your pharmacy.

## 2015-10-08 NOTE — Progress Notes (Signed)
Patient ID: Mark Mullen, male   DOB: 19-May-1935, 80 y.o.   MRN: [id number]    Nursing Home Location:  [facility]   Place of Service: SNF (31)   Code Status: DNR   PCP: No primary care provider on file.  No Known Allergies  Chief Complaint  Patient presents with  . Discharge Note    Discharge from SNF     HPI:  Patient is a 80 y.o. male seen today at [facility] for discharge home. Pt was hospitalized from 9/26 - 10/3 where he was found to have acute respiratory failure from acute CHF, sepsis from a LLL PNA and AF with RVR. Hospitalization was complicated by ETOH withdrawal and urinary retention. No ongoing issues with urinary retention. Patient currently doing well with therapy, now stable to discharge home with home health.  Review of Systems:  Review of Systems  Constitutional: Negative for activity change, appetite change, fatigue and unexpected weight change.  HENT: Negative for congestion and hearing loss.   Eyes: Negative.   Respiratory: Negative for cough and shortness of breath.   Cardiovascular: Negative for chest pain, palpitations and leg swelling.  Gastrointestinal: Negative for abdominal pain, diarrhea and constipation.  Genitourinary: Negative for dysuria and difficulty urinating.  Musculoskeletal: Negative for myalgias and arthralgias.  Skin: Negative for color change and wound.  Neurological: Negative for dizziness and weakness.  Psychiatric/Behavioral: Negative for behavioral problems, confusion and agitation.    Past Medical History  Diagnosis Date  . CHF (congestive heart failure) (HCC)   . Hypertension   . Atrial fibrillation (HCC)   . Alcohol abuse    Past Surgical History  Procedure Laterality Date  . Tonsillectomy     Social History:   reports that he has been smoking.  He does not have any smokeless tobacco history on file. He reports that he drinks alcohol. He reports that he does not use illicit  drugs.  Family History  Problem Relation Age of Onset  . Brain cancer Mother   . Aneurysm Father   . Diabetes Sister   . Diabetes Brother     Medications: Patient's Medications  New Prescriptions   No medications on file  Previous Medications   ASCORBIC ACID (VITAMIN C PO)    Take 500 mg by mouth daily.    CARVEDILOL (COREG) 12.5 MG TABLET    Take 1 tablet (12.5 mg total) by mouth 2 (two) times daily with a meal.   CHOLECALCIFEROL (VITAMIN D PO)    Take 1,000 Units by mouth daily.    CLOTRIMAZOLE-BETAMETHASONE EX    Apply 1 application topically 2 (two) times daily.   FUROSEMIDE (LASIX) 40 MG TABLET    Take 1 tablet (40 mg total) by mouth daily.   LISINOPRIL (PRINIVIL,ZESTRIL) 10 MG TABLET    Take 1 tablet (10 mg total) by mouth daily.   RIVAROXABAN (XARELTO) 20 MG TABS TABLET    Take 1 tablet (20 mg total) by mouth daily with supper.  Modified Medications   No medications on file  Discontinued Medications   No medications on file     Physical Exam: Filed Vitals:   10/08/15 1058  BP: 118/64  Pulse: 58  Resp: 19    Physical Exam  Constitutional: He is oriented to person, place, and time. He appears well-developed and well-nourished. No distress.  HENT:  Head: Normocephalic and atraumatic.  Mouth/Throat: Oropharynx is clear and moist. No oropharyngeal exudate.  Eyes: Conjunctivae and EOM are normal.  Pupils are equal, round, and reactive to light.  Neck: Normal range of motion. Neck supple.  Cardiovascular: Normal rate, regular rhythm and normal heart sounds.   Pulmonary/Chest: Effort normal and breath sounds normal.  Abdominal: Soft. Bowel sounds are normal.  Musculoskeletal: He exhibits no edema or tenderness.  Neurological: He is alert and oriented to person, place, and time.  Skin: Skin is warm and dry. He is not diaphoretic.  Psychiatric: He has a normal mood and affect.    Labs reviewed: Basic Metabolic Panel:  Recent Labs  82/95/62 1650  09/17/15 0400  09/18/15 0433 09/19/15 0411  NA 131*  < > 137 132* 134*  K 4.8  < > 4.3 5.2* 3.8  CL 95*  < > 95* 92* 91*  CO2 26  < > 33* 33* 36*  GLUCOSE 142*  < > 123* 125* 130*  BUN 25*  < > CREATININE 1.30*  < > 0.92 0.97 1.15  CALCIUM 9.2  < > 9.0 8.9 9.0  MG 2.0  --   --   --   --   < > = values in this interval not displayed. Liver Function Tests: No results for input(s): AST, ALT, ALKPHOS, BILITOT, PROT, ALBUMIN in the last 8760 hours. No results for input(s): LIPASE, AMYLASE in the last 8760 hours. No results for input(s): AMMONIA in the last 8760 hours. CBC:  Recent Labs  09/13/15 1650  09/17/15 0400 09/18/15 0433 09/19/15 0411  WBC 13.1*  < > 10.9* 9.7 10.9*  NEUTROABS 10.9*  --   --   --   --   HGB 17.1*  < > 16.6 17.0 16.1  HCT 49.6  < > 49.7 51.0 48.3  MCV 91.0  < > 94.5 92.4 91.5  PLT 237  < > 190 184 216  < > = values in this interval not displayed. TSH:  Recent Labs  09/13/15 1650  TSH 0.964   A1C: No results found for: HGBA1C Lipid Panel: No results for input(s): CHOL, HDL, LDLCALC, TRIG, CHOLHDL, LDLDIRECT in the last 8760 hours.  Radiological Exams: Ct Head Wo Contrast  09/13/2015  CLINICAL DATA:  Slurred speech. EXAM: CT HEAD WITHOUT CONTRAST TECHNIQUE: Contiguous axial images were obtained from the base of the skull through the vertex without intravenous contrast. COMPARISON:  None. FINDINGS: The brainstem, cerebellum, cerebral peduncles, thalamus, basal ganglia, basilar cisterns, and ventricular system appear within normal limits. Periventricular white matter and corona radiata hypodensities favor chronic ischemic microvascular white matter disease. No intracranial hemorrhage, mass lesion, or acute CVA. Chronic ethmoid and left maxillary sinusitis. IMPRESSION: 1. No acute intracranial findings. 2. Periventricular white matter and corona radiata hypodensities favor chronic ischemic microvascular white matter disease. 3. Chronic ethmoid and left maxillary  sinusitis. Electronically Signed   By: Gaylyn Rong M.D.   On: 09/13/2015 17:12   Dg Chest Portable 1 View  09/13/2015  CLINICAL DATA:  Dyspnea.  Fatigue.  Altered speech. EXAM: PORTABLE CHEST 1 VIEW COMPARISON:  None. FINDINGS: Midline trachea. Normal heart size for level of inspiration. Probable small left pleural effusion. No pneumothorax. Patchy left base opacities, obscuring the left hemidiaphragm. IMPRESSION: Suspicion of left lower lobe airspace disease/ pneumonia with small left pleural effusion. Consider PA and lateral radiographs for further evaluation. Electronically Signed   By: Jeronimo Greaves M.D.   On: 09/13/2015 17:21   Assessment/Plan 1. Acute combined systolic and diastolic congestive heart failure (HCC) 09/14/2015 revealed an ejection fraction of 30-35% with diffuse hypokinesis  and grade 2 diastolic dysfunction. Placed on ACE inhibitor, coreg and lasix 40 mg daily   2. Paroxysmal atrial fibrillation (HCC) Rate controlled on coreg, conts on xarelo for anticoagulation   3. Malnutrition of moderate degree (HCC) Pt with good appetite, cont nutritional supplements   4. CAP (community acquired pneumonia) -resolved, He received 7 days of IV antimicrobial therapy during hospitalization   5. Alcohol withdrawal, with delirium (HCC) -withdrawal has resolved.   6. Hypertensive heart disease with CHF (congestive heart failure) (HCC) -blood pressure stable, conts on lisinopril, coreg BID  7. AKI (acute kidney injury) (HCC) BUN/Cr stable, ongoing follow up by cardiology as well.   pt is stable for discharge-will need PT/OT/Nursing per home health. DME needed rolling walker. Rx written.  will need to follow up with PCP within 2 weeks.    Janene Harvey. Biagio Borg  [facility] [phone] 8 am - 5 pm) [phone] (after hours)

## 2015-10-08 NOTE — Assessment & Plan Note (Signed)
CHADS VASc=3

## 2015-10-08 NOTE — Assessment & Plan Note (Signed)
Xarelto

## 2015-10-08 NOTE — Assessment & Plan Note (Signed)
Recent hospitalization with sepsis, acute renal injury, CHF

## 2015-10-08 NOTE — Assessment & Plan Note (Signed)
Suspected to be from acute sepsis or ETOH but will need to r/o ischemia

## 2015-10-15 ENCOUNTER — Telehealth (HOSPITAL_COMMUNITY): Payer: Self-pay

## 2015-10-15 NOTE — Telephone Encounter (Signed)
Encounter complete. 

## 2015-10-18 DIAGNOSIS — I4891 Unspecified atrial fibrillation: Secondary | ICD-10-CM

## 2015-10-18 DIAGNOSIS — I252 Old myocardial infarction: Secondary | ICD-10-CM

## 2015-10-18 DIAGNOSIS — I110 Hypertensive heart disease with heart failure: Secondary | ICD-10-CM

## 2015-10-18 DIAGNOSIS — F10231 Alcohol dependence with withdrawal delirium: Secondary | ICD-10-CM | POA: Diagnosis not present

## 2015-10-18 DIAGNOSIS — F17210 Nicotine dependence, cigarettes, uncomplicated: Secondary | ICD-10-CM | POA: Diagnosis not present

## 2015-10-18 DIAGNOSIS — I5040 Unspecified combined systolic (congestive) and diastolic (congestive) heart failure: Secondary | ICD-10-CM

## 2015-10-20 ENCOUNTER — Ambulatory Visit (HOSPITAL_COMMUNITY)
Admission: RE | Admit: 2015-10-20 | Discharge: 2015-10-20 | Disposition: A | Discharge status: Home / Self Care | Payer: Medicare Other | Source: Ambulatory Visit | Attending: Internal Medicine | Admitting: Internal Medicine

## 2015-10-20 DIAGNOSIS — I429 Cardiomyopathy, unspecified: Secondary | ICD-10-CM

## 2015-10-20 DIAGNOSIS — R0602 Shortness of breath: Secondary | ICD-10-CM | POA: Insufficient documentation

## 2015-10-20 DIAGNOSIS — R9439 Abnormal result of other cardiovascular function study: Secondary | ICD-10-CM | POA: Diagnosis not present

## 2015-10-20 DIAGNOSIS — F17200 Nicotine dependence, unspecified, uncomplicated: Secondary | ICD-10-CM | POA: Diagnosis not present

## 2015-10-20 DIAGNOSIS — I517 Cardiomegaly: Secondary | ICD-10-CM | POA: Insufficient documentation

## 2015-10-20 LAB — MYOCARDIAL PERFUSION IMAGING
Peak HR: 75 {beats}/min
Rest HR: 50 {beats}/min
SDS: 2
SRS: 6
SSS: 8
TID: 1.03

## 2015-10-20 MED ORDER — TECHNETIUM TC 99M SESTAMIBI GENERIC - CARDIOLITE
10.6000 | Freq: Once | INTRAVENOUS | Status: AC | PRN
Start: 2015-10-20 — End: 2015-10-20
  Administered 2015-10-20: 11 via INTRAVENOUS

## 2015-10-20 MED ORDER — REGADENOSON 0.4 MG/5ML IV SOLN
0.4000 mg | Freq: Once | INTRAVENOUS | Status: AC
  Administered 2015-10-20: 0.4 mg via INTRAVENOUS

## 2015-10-20 MED ORDER — TECHNETIUM TC 99M SESTAMIBI GENERIC - CARDIOLITE
31.6000 | Freq: Once | INTRAVENOUS | Status: AC | PRN
  Administered 2015-10-20: 32 via INTRAVENOUS

## 2015-10-28 ENCOUNTER — Encounter: Payer: Self-pay | Admitting: Cardiology

## 2015-10-28 ENCOUNTER — Ambulatory Visit (INDEPENDENT_AMBULATORY_CARE_PROVIDER_SITE_OTHER): Payer: Medicare Other | Admitting: Cardiology

## 2015-10-28 VITALS — BP 148/78 | HR 49 | Ht 68.0 in | Wt 145.3 lb

## 2015-10-28 DIAGNOSIS — Z79899 Other long term (current) drug therapy: Secondary | ICD-10-CM

## 2015-10-28 MED ORDER — LISINOPRIL 10 MG PO TABS: 10.0000 mg | ORAL_TABLET | Freq: Two times a day (BID) | ORAL | Status: DC

## 2015-10-28 NOTE — Progress Notes (Signed)
Cardiology Office Note   Date:  10/28/2015   ID:  Mark Mullen, DOB Sep 25, 1935, MRN [id number]  PCP:  No primary care provider on file.  Cardiologist:   Minus Breeding, MD   Chief Complaint  Patient presents with  . Cardiomyopathy      History of Present Illness: Mark Mullen is a 80 y.o. male who presents for  Evaluation of cardiomyopathy. I met him in the hospital when he was there with pneumonia and sepsis. He had atrial fibrillation with a rapid rate. He did have elevated cardiac enzymes. He was also found to have a cardiomyopathy with an EF of about 25% with diffuse hypokinesis. Upon discharge she did come back and see [place] and had a stress perfusion study which demonstrated a fixed inferior and inferoapical perfusion defect suggestive of scar. He does have extensive EtOH history. However, he's now living at an assisted living facility and no longer drinks alcohol.    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   She reports doing relatively well. He says he is having no shortness of breath, PND or orthopnea. He's not noticing any palpitations, presyncope or syncope. He apparently has had some slowly progressive memory disorder.  Past Medical History  Diagnosis Date  . CHF (congestive heart failure) ([place])   . Hypertension   . Atrial fibrillation ([place])   . Alcohol abuse     Past Surgical History  Procedure Laterality Date  . Tonsillectomy       Current Outpatient Prescriptions  Medication Sig Dispense Refill  . Ascorbic Acid (VITAMIN C PO) Take 500 mg by mouth daily.     . carvedilol (COREG) 12.5 MG tablet Take 1 tablet (12.5 mg total) by mouth 2 (two) times daily with a meal. 60 tablet 0  . Cholecalciferol (VITAMIN D PO) Take 1,000 Units by mouth daily.     Marland Kitchen CLOTRIMAZOLE-BETAMETHASONE EX Apply 1 application topically 2 (two) times daily.    . furosemide (LASIX) 40 MG tablet Take 1 tablet (40 mg total) by mouth daily. 30 tablet 0  . lisinopril  (PRINIVIL,ZESTRIL) 10 MG tablet Take 1 tablet (10 mg total) by mouth 2 (two) times daily. 60 tablet 6  . rivaroxaban (XARELTO) 20 MG TABS tablet Take 1 tablet (20 mg total) by mouth daily with supper. 30 tablet 0   No current facility-administered medications for this visit.    Allergies:   Review of patient's allergies indicates no known allergies.     ROS:  Please see the history of present illness.   Otherwise, review of systems are positive for none.   All other systems are reviewed and negative.    PHYSICAL EXAM: VS:  BP 148/78 mmHg  Pulse 49  Ht _0  (1.727 m)  Wt 145 lb 4.8 oz (65.908 kg)  BMI 22.10 kg/m2 , BMI Body mass index is 22.1 kg/(m^2). GENERAL:  Well appearing HEENT:  Pupils equal round and reactive, fundi not visualized, oral mucosa unremarkable NECK:  No jugular venous distention, waveform within normal limits, carotid upstroke brisk and symmetric, no bruits, no thyromegaly LYMPHATICS:  No cervical, inguinal adenopathy LUNGS:  Clear to auscultation bilaterally BACK:  No CVA tenderness CHEST:  Unremarkable HEART:  PMI not displaced or sustained,S1 and S2 within normal limits, no S3, no S4, no clicks, no rubs, no murmurs ABD:  Flat, positive bowel sounds normal in frequency in pitch, no bruits, no rebound, no guarding, no midline pulsatile mass, no hepatomegaly,  no splenomegaly EXT:  2 plus pulses throughout, no edema, no cyanosis no clubbing SKIN:  No rashes no nodules NEURO:  Cranial nerves II through XII grossly intact, motor grossly intact throughout PSYCH:  Cognitively intact, oriented to person place and time    EKG:  EKG is not ordered today.   Recent Labs: 09/13/2015: B Natriuretic Peptide 1302.1*; Magnesium 2.0; TSH 0.964 09/19/2015: BUN 19; Creatinine, Ser 1.15; Hemoglobin 16.1; Platelets 216; Potassium 3.8; Sodium 134*    Lipid Panel No results found for: CHOL, TRIG, HDL, CHOLHDL, VLDL, LDLCALC, LDLDIRECT    Wt Readings from Last 3 Encounters:    10/28/15 145 lb 4.8 oz (65.908 kg)  10/20/15 148 lb (67.132 kg)  10/08/15 138 lb (62.596 kg)      Other studies Reviewed: Additional studies/ records that were reviewed today include: Hospital records an [facility] Review of the above records demonstrates:  Please see elsewhere in the note.     ASSESSMENT AND PLAN:  Cardiomyopathy -  The perfusion study suggests that this could be ischemic. However, he's not having any symptoms anemia fixed defect. This could also be related to previous alcohol  Use. For now I'm going to titrate his medications by increasing lisinopril to 10 mg twice daily. I will check a basic metabolic profile in about 10 days. w will continue to titrate meds and follow-up his echo in the future.  Acute combined systolic and diastolic congestive heart failure Compensated  PAF (paroxysmal atrial fibrillation) ([place]) CHADS VASc=3 .   He may have had this only related to his acute illness. In the future if I see no recurrent dysrhythmia I will stop his blood thinner.   Current medicines are reviewed at length with the patient today.  The patient does not have concerns regarding medicines.  The following changes have been made:  As above  Labs/ tests ordered today include:   Orders Placed This Encounter  Procedures  . Basic Metabolic Panel (BMET)     Disposition:   FU with me in six weeks.    Signed, Minus Breeding, MD  10/28/2015 2:33 PM    [facility]

## 2015-10-28 NOTE — Patient Instructions (Addendum)
Your physician recommends that you schedule a follow-up appointment in: 6 Weeks  Your physician has recommended you make the following change in your medication: INCREASE Lisinopril 10 mg twice a day  Your physician recommends that you return for lab work in: 10 days BMP

## 2015-12-03 ENCOUNTER — Encounter: Payer: Self-pay | Admitting: Cardiology

## 2015-12-03 ENCOUNTER — Ambulatory Visit (INDEPENDENT_AMBULATORY_CARE_PROVIDER_SITE_OTHER): Payer: Medicare Other | Admitting: Cardiology

## 2015-12-03 VITALS — BP 122/72 | HR 51 | Ht 68.0 in | Wt 145.3 lb

## 2015-12-03 DIAGNOSIS — R609 Edema, unspecified: Secondary | ICD-10-CM | POA: Diagnosis not present

## 2015-12-03 DIAGNOSIS — R413 Other amnesia: Secondary | ICD-10-CM | POA: Diagnosis not present

## 2015-12-03 MED ORDER — FUROSEMIDE 20 MG PO TABS: 20.0000 mg | ORAL_TABLET | Freq: Every day | ORAL | Status: DC

## 2015-12-03 NOTE — Patient Instructions (Addendum)
Your physician has recommended you make the following change in your medication: STOP Xarelto, START Baby Aspirin and Decrease Lasix 20 mg daily  Your physician has recommended you wear a Compression Stocking daily  You have been referred to [facility]

## 2015-12-03 NOTE — Progress Notes (Signed)
Cardiology Office Note   Date:  12/03/2015   ID:  Mark Mullen, DOB 11-Nov-1935, MRN [id number]  PCP:  No primary care provider on file.  Cardiologist:   Minus Breeding, MD   Chief Complaint  Patient presents with  . Atrial Fibrillation      History of Present Illness: Mark Mullen is a 80 y.o. male who presents for evaluation of cardiomyopathy. I met him in the hospital when he was there with pneumonia and sepsis. He had atrial fibrillation with a rapid rate. He did have elevated cardiac enzymes. He was also found to have a cardiomyopathy with an EF of about 25% with diffuse hypokinesis. Upon discharge he did come back and see [place] and had a stress perfusion study which demonstrated a fixed inferior and inferoapical perfusion defect suggestive of scar. I decided to manage him medically.  He does have extensive EtOH history. He's now living at an assisted living facility and no longer drinks alcohol.  He comes with his ex-wife and she brings a long letter that she handed to me to read prior to going into the room.  He is having lots of neurologic difficulties.  He has a decreased gait. He's not wanting to use his walker. He hasn't fallen yet. His memory is very poor.  The patient denies any new symptoms such as chest discomfort, neck or arm discomfort. There has been no new shortness of breath, PND or orthopnea. There have been no reported palpitations, presyncope or syncope.  Past Medical History  Diagnosis Date  . CHF (congestive heart failure) ([place])   . Hypertension   . Atrial fibrillation ([place])   . Alcohol abuse     Past Surgical History  Procedure Laterality Date  . Tonsillectomy       Current Outpatient Prescriptions  Medication Sig Dispense Refill  . Ascorbic Acid (VITAMIN C PO) Take 500 mg by mouth daily.     . carvedilol (COREG) 12.5 MG tablet Take 1 tablet (12.5 mg total) by mouth 2 (two) times daily with a meal. 60 tablet 0  . Cholecalciferol (VITAMIN D PO) Take  1,000 Units by mouth daily.     . clotrimazole-betamethasone (LOTRISONE) cream Apply 1 application topically 2 (two) times daily.    . furosemide (LASIX) 40 MG tablet Take 1 tablet (40 mg total) by mouth daily. 30 tablet 0  . lisinopril (PRINIVIL,ZESTRIL) 10 MG tablet Take 1 tablet (10 mg total) by mouth 2 (two) times daily. 60 tablet 6  . rivaroxaban (XARELTO) 20 MG TABS tablet Take 1 tablet (20 mg total) by mouth daily with supper. 30 tablet 0   No current facility-administered medications for this visit.    Allergies:   Review of patient's allergies indicates no known allergies.     ROS:  Please see the history of present illness.   Otherwise, review of systems are positive for none.   All other systems are reviewed and negative.    PHYSICAL EXAM: VS:  BP 122/72 mmHg  Pulse 51  Ht 5' 8"  (1.727 m)  Wt 145 lb 4.8 oz (65.908 kg)  BMI 22.10 kg/m2 , BMI Body mass index is 22.1 kg/(m^2). GENERAL:  Frail appearing HEENT:  Pupils equal round and reactive, fundi not visualized, oral mucosa unremarkable NECK:  No jugular venous distention, waveform within normal limits, carotid upstroke brisk and symmetric, no bruits, no thyromegaly LUNGS:  Clear to auscultation bilaterally BACK:  No CVA tenderness CHEST:  Unremarkable HEART:  PMI not displaced or  sustained,S1 and S2 within normal limits, no S3, no S4, no clicks, no rubs, no murmurs ABD:  Flat, positive bowel sounds normal in frequency in pitch, no bruits, no rebound, no guarding, no midline pulsatile mass, no hepatomegaly, no splenomegaly EXT:  2 plus pulses throughout, no edema, no cyanosis no clubbing SKIN:  No rashes no nodules     EKG:  EKG is not ordered today.   Recent Labs: 09/13/2015: B Natriuretic Peptide 1302.1*; Magnesium 2.0; TSH 0.964 09/19/2015: BUN 19; Creatinine, Ser 1.15; Hemoglobin 16.1; Platelets 216; Potassium 3.8; Sodium 134*    Lipid Panel No results found for: CHOL, TRIG, HDL, CHOLHDL, VLDL, LDLCALC,  LDLDIRECT    Wt Readings from Last 3 Encounters:  12/03/15 145 lb 4.8 oz (65.908 kg)  10/28/15 145 lb 4.8 oz (65.908 kg)  10/20/15 148 lb (67.132 kg)      Other studies Reviewed: Additional studies/ records that were reviewed today include: Hospital records an [facility] Review of the above records demonstrates:  Please see elsewhere in the note.     ASSESSMENT AND PLAN:  Cardiomyopathy -  The perfusion study suggests that this could be ischemic. However, he's not having any symptoms and has a fixed defect. This could also be related to previous alcohol use. I will continue to manage him medically.  I would like to see if he can tolerate lower dose of Lasix at 20 mg. Otherwise I'm not going to titrate his medications with his frailty and low heart rate.    Acute combined systolic and diastolic congestive heart failure Compensated as above  PAF (paroxysmal atrial fibrillation) ([place]) CHADS VASc=3 .   However, his only fibrillation was when he was acutely septic. I see no other dysrhythmia. He is at high risk for bleed with potential for falls. Therefore, we will stop his anticoagulation.  NEURO:  He has had for his ex-wife failing neurologic health. I have scanned her letter into the medical record.  I've given her the name of neurologists to arrange follow-up.  He needs continued nursing home placement as he is unable to live on his own.   Current medicines are reviewed at length with the patient today.  The patient does not have concerns regarding medicines.  The following changes have been made:  As above  Labs/ tests ordered today include:   No orders of the defined types were placed in this encounter.     Disposition:   FU with me in six months.   Signed, Minus Breeding, MD  12/03/2015 1:50 PM    [facility]

## 2016-01-19 ENCOUNTER — Ambulatory Visit (INDEPENDENT_AMBULATORY_CARE_PROVIDER_SITE_OTHER): Payer: Medicare Other | Admitting: Neurology

## 2016-01-19 ENCOUNTER — Encounter: Payer: Self-pay | Admitting: Neurology

## 2016-01-19 VITALS — BP 152/90 | HR 67 | Resp 16 | Wt 157.0 lb

## 2016-01-19 DIAGNOSIS — I10 Essential (primary) hypertension: Secondary | ICD-10-CM

## 2016-01-19 DIAGNOSIS — F0390 Unspecified dementia without behavioral disturbance: Secondary | ICD-10-CM | POA: Diagnosis not present

## 2016-01-19 DIAGNOSIS — I4891 Unspecified atrial fibrillation: Secondary | ICD-10-CM

## 2016-01-19 DIAGNOSIS — F03A0 Unspecified dementia, mild, without behavioral disturbance, psychotic disturbance, mood disturbance, and anxiety: Secondary | ICD-10-CM

## 2016-01-19 MED ORDER — DONEPEZIL HCL 10 MG PO TABS: ORAL_TABLET | ORAL | Status: DC

## 2016-01-19 NOTE — Progress Notes (Signed)
NEUROLOGY CONSULTATION NOTE  Mark Mullen MRN: [id number] DOB: 1935-11-04  Referring provider: Dr. Rollene Rotunda  Primary care provider: Dr. Florentina Jenny  Reason for consult:  Memory loss  Dear Dr Antoine Poche:  Thank you for your kind referral of Mark Mullen for consultation of the above symptoms. Although his history is well known to you, please allow me to reiterate it for the purpose of our medical record. The patient was accompanied to the clinic by his ex-wife who also provides collateral information. Records and images were personally reviewed where available.  HISTORY OF PRESENT ILLNESS: This is a very pleasant 81 year old right-handed man with a history of hypertension, cardiomyopathy, atrial fibrillation, presenting for evaluation of memory loss. [name] provides majority of the history as he cannot recall some of this. He had been living alone and family had been unaware of memory changes until he was admitted to [facility] in September 2016. Records reviewed, he is somewhat reclusive but family had been unable to reach him by phone for over a week and sent a neighbor to check on him. His neighbor found him tired and sluggish, but he sent his neighbor away. EMS was called to his house, and he was found confused, lethargic, and hypoxic. He was in atrial fibrillation on arrival to the ER, with CHF and pneumonia. He was discharged to a SNF then is now living in assisted living at [facility]. While he was admitted to the hospital, his son found out that since January 2016 he had been doing very damaging decision-making which had left him with hardly any finances. He was scanned on the phone and had been giving enormous amounts of money to mail and phone solicitations. His family found out that his bank had called him to stop giving money, but the scammers were persistent and told him to use a different money wiring agency. He continued to wire money until he was admitted to the hospital. He  has not been driving since his hospitalization, but denies getting lost driving previously. His wife reports he would get confused when he is a passenger in his car, afraid they would have an accident. He had not been taking any medications prior to his hospital stay, and his ex-wife feels he would not be able to dispense his medications on a daily basis. He is a retired English as a second language teacher, but now plays unrecognizable music on the piano, unaware he is playing the wrong notes or incorrect keys. Sometimes his hands hover over the keyboard before playing. He cannot seem to follow instructions even if they are noted on his calendar, calling family repeatedly and needing constant reassurance and reminders. His wife also reports personality changes, he has some paranoia and has become very rigid in his routine "to the point of neurosis." When his children from out of state come to town, he wants to know the exact hour and repeatedly asks about it. He is extremely organized but has difficulty operating the phone, such as how to adjust the volume. He is able to dress and bathe independently.  He has occasional dizziness (floating sensation). His last fall in September, when his neighbor found him, he was bruised all over his head, he does not recall much of that incident. He has occasional blurred vision, no diplopia, dysarthria, dysphagia, neck/back pain, focal numbness/tingling. He has noticed some weakness in his hands, with a change in dexterity and coordination. He reports some word-finding difficulties. His wife has noticed a change  in his gait, describing it as shuffling and leaning forward. He now uses a walker at all times. He has occasional constipation. No anosmia or tremors, except when he is anxious. He has a history of alcohol use, but has not been drinking since hospital stay.  Laboratory Data: Lab Results  Component Value Date   WBC 10.9* 09/19/2015   HGB 16.1 09/19/2015   HCT  48.3 09/19/2015   MCV 91.5 09/19/2015   PLT 216 09/19/2015     Chemistry      Component Value Date/Time   NA 134* 09/19/2015 0411   K 3.8 09/19/2015 0411   CL 91* 09/19/2015 0411   CO2 36* 09/19/2015 0411   BUN 19 09/19/2015 0411   CREATININE 1.15 09/19/2015 0411      Component Value Date/Time   CALCIUM 9.0 09/19/2015 0411     Lab Results  Component Value Date   TSH 0.964 09/13/2015     PAST MEDICAL HISTORY: Past Medical History  Diagnosis Date  . CHF (congestive heart failure) ([facility])   . Hypertension   . Atrial fibrillation ([facility])   . Alcohol abuse     PAST SURGICAL HISTORY: Past Surgical History  Procedure Laterality Date  . Tonsillectomy      MEDICATIONS: Current Outpatient Prescriptions on File Prior to Visit  Medication Sig Dispense Refill  . Ascorbic Acid (VITAMIN C PO) Take 500 mg by mouth daily.     . Cholecalciferol (VITAMIN D PO) Take 1,000 Units by mouth daily.     . furosemide (LASIX) 20 MG tablet Take 1 tablet (20 mg total) by mouth daily. 30 tablet 6  . lisinopril (PRINIVIL,ZESTRIL) 10 MG tablet Take 1 tablet (10 mg total) by mouth 2 (two) times daily. 60 tablet 6   No current facility-administered medications on file prior to visit.    ALLERGIES: No Known Allergies  FAMILY HISTORY: Family History  Problem Relation Age of Onset  . Brain cancer Mother   . Aneurysm Father   . Diabetes Sister   . Diabetes Brother     SOCIAL HISTORY: Social History   Social History  . Marital Status: Divorced    Spouse Name: N/A  . Number of Children: 2  . Years of Education: N/A   Occupational History  . Retired    Social History Main Topics  . Smoking status: Former Smoker    Types: Cigarettes    Quit date: 09/13/2015  . Smokeless tobacco: Never Used  . Alcohol Use: No     Comment: former daily user  . Drug Use: No  . Sexual Activity: Not on file   Other Topics Concern  . Not on file   Social History Narrative    REVIEW OF  SYSTEMS: Constitutional: No fevers, chills, or sweats, no generalized fatigue, change in appetite Eyes: No visual changes, double vision, eye pain Ear, nose and throat: No hearing loss, ear pain, nasal congestion, sore throat Cardiovascular: No chest pain, palpitations Respiratory:  No shortness of breath at rest or with exertion, wheezes GastrointestinaI: No nausea, vomiting, diarrhea, abdominal pain, fecal incontinence Genitourinary:  No dysuria, urinary retention or frequency Musculoskeletal:  No neck pain, back pain Integumentary: No rash, pruritus, skin lesions Neurological: as above Psychiatric: No depression, insomnia, anxiety Endocrine: No palpitations, fatigue, diaphoresis, mood swings, change in appetite, change in weight, increased thirst Hematologic/Lymphatic:  No anemia, purpura, petechiae. Allergic/Immunologic: no itchy/runny eyes, nasal congestion, recent allergic reactions, rashes  PHYSICAL EXAM: Filed Vitals:   01/19/16 0942  BP:  152/90  Pulse: 67  Resp: 16   General: No acute distress Head:  Normocephalic/atraumatic Eyes: Fundoscopic exam shows bilateral sharp discs, no vessel changes, exudates, or hemorrhages Neck: supple, no paraspinal tenderness, full range of motion Back: No paraspinal tenderness Heart: regular rate and rhythm Lungs: Clear to auscultation bilaterally. Vascular: No carotid bruits. Skin/Extremities: No rash, no edema Neurological Exam: Mental status: alert and oriented to person, place, states month is January, year is 2013, no dysarthria or aphasia, Fund of knowledge is appropriate.  Remote memory intact.  Attention and concentration are normal.    Able to name objects and repeat phrases.  Montreal Cognitive Assessment  01/19/2016  Visuospatial/ Executive (0/5) 4  Naming (0/3) 3  Attention: Read list of digits (0/2) 2  Attention: Read list of letters (0/1) 0  Attention: Serial 7 subtraction starting at 100 (0/3) 3  Language: Repeat phrase  (0/2) 2  Language : Fluency (0/1) 0  Abstraction (0/2) 1  Delayed Recall (0/5) 0  Orientation (0/6) 4  Total 19  Adjusted Score (based on education) 19   Cranial nerves: CN I: not tested CN II: pupils equal, round and reactive to light, visual fields intact, fundi unremarkable. CN III, IV, VI:  full range of motion, no nystagmus, no ptosis CN V: facial sensation intact CN VII: upper and lower face symmetric CN VIII: hearing intact to finger rub CN IX, X: gag intact, uvula midline CN XI: sternocleidomastoid and trapezius muscles intact CN XII: tongue midline Bulk & Tone: normal, no cogwheeling, no fasciculations. Motor: 5/5 throughout with no pronator drift. Sensation: decreased vibration to ankles bilaterally, otherwise intact to light touch, cold, pin, and joint position sense.  No extinction to double simultaneous stimulation.  Romberg test negative Deep Tendon Reflexes: +1 on both UE, +2 bilateral patella, absent ankle jerks bilaterally, no ankle clonus Plantar responses: downgoing bilaterally Cerebellar: no incoordination on finger to nose testing Gait: narrow-based, hunched posture, good arm swing, no ataxia. Mild difficulty with tandem walk but able Tremor: none Able to rise from chair with arms folded over chest, no postural instability.  IMPRESSION: This is a very pleasant 81 year old right-handed man who was admitted in September 2016 for altered mental status, found to have atrial fibrillation, CHF, and pneumonia. He had not been taking any medications prior to his hospitalization. His family has found out that he had been scammed and giving large amounts of money away since January 2016, unable to take care of his finances. 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 He needs assistance with taking medications regularly. His neurological exam shows mild length-dependent neuropathy, otherwise non-focal. MOCA score today 19/30, which is  suggestive of mild cognitive impairment, however by history, symptoms are more consistent with mild dementia. With his history of atrial fibrillation, MRI brain without contrast will be ordered to assess for underlying structural abnormality and assess vascular load. He may benefit from starting cholinesterase inhibitors such as Aricept, side effects and expectations from the medication were discussed. He will start  daily for 1 month, then increase to  daily. At this point, I agree with recommendation that he should not live alone, recommend continued nursing home care. He should not drive. We discussed the importance of control of vascular risk factors, physical exercise, and brain stimulation exercises for brain health. He will follow-up in 7-8 months and knows to call for any changes.   Thank you for allowing me to participate in the care  of this patient. Please do not hesitate to call for any questions or concerns.   Patrcia Dolly, M.D.  CC: Dr. Antoine Poche, Dr. Redmond School

## 2016-01-19 NOTE — Patient Instructions (Addendum)
1. Schedule MRI brain without contrast 2. Start Aricept 10mg : Take 1/2 tablet daily for 1 month, then increase to 1 tablet daily 3. Physical exercise and brain stimulation exercises are important for brain health 4. Strongly recommend 24/7 supervision, no driving 5. Follow-up in 7 months, call for any problems

## 2016-01-20 ENCOUNTER — Telehealth: Payer: Self-pay | Admitting: Neurology

## 2016-01-20 NOTE — Telephone Encounter (Signed)
Returned call. She wanted to make [place] aware that patient's facility([facility]) needed specifics on what or if patient needed help with as far as supervision & ADL's so they could have it on record. I told her that I called and left a msg this morning for the wellness coordinator at [facility] to call me back so we can discuss there policy on documenting patient's needs.

## 2016-01-20 NOTE — Telephone Encounter (Signed)
Mark Mullen called in regards to PT and wanted a call back in regards to the diagnosis and his insurance co/Dawn CB#[phone]

## 2016-01-20 NOTE — Telephone Encounter (Signed)
Mark Mullen wants to talk to someone about a letter that [facility] needs for patient please call [phone]

## 2016-01-21 ENCOUNTER — Telehealth: Payer: Self-pay | Admitting: Family Medicine

## 2016-01-21 NOTE — Telephone Encounter (Signed)
I spoke with Mark Mullen with [facility]. She explained that he isn't in the type of facility for around the clock care. Patient is pretty independent. They already have an FL2 in place for him that can't be changed. She states that they can have someone assist him with his compression hose and have stand by assistance for him when he baths. He can feed himself, bath himself, and dress himself. He's already been evaluated by a nurse with long term care insurance and it was noted that right now he is independent. If he needs help with compression stockings or stand by assistance with bathing, or anything that you think he would need some assistance with or watch over please notate that in the order. Once complete I will fax to [facility].  [phone]

## 2016-01-21 NOTE — Telephone Encounter (Signed)
Dr. Karel Jarvis is working on letter for patient's ins and once completed will also forward to [facility].

## 2016-01-26 ENCOUNTER — Encounter: Payer: Self-pay | Admitting: Cardiology

## 2016-01-31 ENCOUNTER — Encounter: Payer: Self-pay | Admitting: Neurology

## 2016-01-31 NOTE — Telephone Encounter (Signed)
Note for [employer] done, can send same note to facility if needed. Thanks

## 2016-01-31 NOTE — Telephone Encounter (Signed)
Noted. Will fax a copy of the letter to [name]'s attn at [facility].

## 2016-02-01 ENCOUNTER — Ambulatory Visit (HOSPITAL_COMMUNITY)
Admission: RE | Admit: 2016-02-01 | Discharge: 2016-02-01 | Disposition: A | Discharge status: Home / Self Care | Payer: Medicare Other | Source: Ambulatory Visit | Attending: Neurology | Admitting: Neurology

## 2016-02-01 DIAGNOSIS — G3189 Other specified degenerative diseases of nervous system: Secondary | ICD-10-CM | POA: Diagnosis not present

## 2016-02-01 DIAGNOSIS — Q048 Other specified congenital malformations of brain: Secondary | ICD-10-CM | POA: Insufficient documentation

## 2016-02-01 DIAGNOSIS — F0390 Unspecified dementia without behavioral disturbance: Secondary | ICD-10-CM | POA: Insufficient documentation

## 2016-02-03 ENCOUNTER — Encounter: Payer: Self-pay | Admitting: Cardiology

## 2016-02-03 ENCOUNTER — Ambulatory Visit (INDEPENDENT_AMBULATORY_CARE_PROVIDER_SITE_OTHER): Payer: Medicare Other | Admitting: Cardiology

## 2016-02-03 VITALS — BP 142/90 | HR 76 | Ht 66.0 in | Wt 158.5 lb

## 2016-02-03 DIAGNOSIS — Z79899 Other long term (current) drug therapy: Secondary | ICD-10-CM

## 2016-02-03 MED ORDER — LISINOPRIL 2.5 MG PO TABS: 2.5000 mg | ORAL_TABLET | Freq: Two times a day (BID) | ORAL | Status: DC

## 2016-02-03 NOTE — Progress Notes (Signed)
Cardiology Office Note   Date:  02/03/2016   ID:  Mark Mullen, DOB [birth year], MRN [id number]  PCP:  Reymundo Poll, MD  Cardiologist:   Minus Breeding, MD   No chief complaint on file.     History of Present Illness: Mark Mullen is a 81 y.o. male who presents for evaluation of cardiomyopathy. I met him in the hospital when he was there with pneumonia and sepsis. He had atrial fibrillation with a rapid rate. He did have elevated cardiac enzymes. He was also found to have a cardiomyopathy with an EF of about 25% with diffuse hypokinesis. Upon discharge he did come back and see [place] and had a stress perfusion study which demonstrated a fixed inferior and inferoapical perfusion defect suggestive of scar. I decided to manage him medically.  He does have extensive EtOH history. He's now living at an assisted living facility and no longer drinks alcohol.  At the ;ast visit I sent him to neurology and he is being treated for dementia and had a recent MRI.    The patient denies any new symptoms such as chest discomfort, neck or arm discomfort. There has been no new shortness of breath, PND or orthopnea. There have been no reported palpitations, presyncope or syncope.  He is getting around with his walker secondary to balance issues.  He tolerated reducing his Lasix at the last visit.     Past Medical History  Diagnosis Date  . CHF (congestive heart failure) ([place])   . Hypertension   . Atrial fibrillation ([place])   . Alcohol abuse     Past Surgical History  Procedure Laterality Date  . Tonsillectomy       Current Outpatient Prescriptions  Medication Sig Dispense Refill  . Ascorbic Acid (VITAMIN C PO) Take 500 mg by mouth daily.     . carvedilol (COREG) 6.25 MG tablet Take 6.25 mg by mouth daily.    . Cholecalciferol (VITAMIN D PO) Take 1,000 Units by mouth daily.     Marland Kitchen donepezil (ARICEPT) 10 MG tablet Take 1/2 tablet daily for 1 month, then increase to 1 tablet daily and continue 30  tablet 11  . furosemide (LASIX) 20 MG tablet Take 1 tablet (20 mg total) by mouth daily. 30 tablet 6  . lisinopril (PRINIVIL,ZESTRIL) 10 MG tablet Take 1 tablet (10 mg total) by mouth 2 (two) times daily. 60 tablet 6   No current facility-administered medications for this visit.    Allergies:   Review of patient's allergies indicates no known allergies.     ROS:  Please see the history of present illness.   Otherwise, review of systems are positive for none.   All other systems are reviewed and negative.    PHYSICAL EXAM: VS:  BP 142/90 mmHg  Pulse 76  Ht 5' 6"  (1.676 m)  Wt 158 lb 8 oz (71.895 kg)  BMI 25.59 kg/m2 , BMI Body mass index is 25.59 kg/(m^2). GENERAL:  Frail appearing HEENT:  Pupils equal round and reactive, fundi not visualized, oral mucosa unremarkable NECK:  No jugular venous distention, waveform within normal limits, carotid upstroke brisk and symmetric, no bruits, no thyromegaly LUNGS:  Clear to auscultation bilaterally BACK:  No CVA tenderness CHEST:  Unremarkable HEART:  PMI not displaced or sustained,S1 and S2 within normal limits, no S3, no S4, no clicks, no rubs, no murmurs ABD:  Flat, positive bowel sounds normal in frequency in pitch, no bruits, no rebound, no guarding, no midline pulsatile mass,  no hepatomegaly, no splenomegaly EXT:  2 plus pulses throughout, no edema, no cyanosis no clubbing SKIN:  No rashes no nodules     EKG:  EKG is not ordered today.   Recent Labs: 09/13/2015: B Natriuretic Peptide 1302.1*; Magnesium 2.0; TSH 0.964 09/19/2015: BUN 19; Creatinine, Ser 1.15; Hemoglobin 16.1; Platelets 216; Potassium 3.8; Sodium 134*    Lipid Panel No results found for: CHOL, TRIG, HDL, CHOLHDL, VLDL, LDLCALC, LDLDIRECT    Wt Readings from Last 3 Encounters:  02/03/16 158 lb 8 oz (71.895 kg)  01/19/16 157 lb (71.215 kg)  12/03/15 145 lb 4.8 oz (65.908 kg)      Other studies Reviewed: Additional studies/ records that were reviewed today  include: Hospital records an [facility].. Review of the above records demonstrates:  Please see elsewhere in the note.     ASSESSMENT AND PLAN:  Cardiomyopathy -  The perfusion study suggests that this could be ischemic. However, he's not having any symptoms and has a fixed defect. This could also be related to previous alcohol use. I will continue to manage him medically.  Today I will increase the lisinopril to 12.5 bid.  He will need a BMET in about 10 days.    Acute combined systolic and diastolic congestive heart failure Compensated as above  PAF (paroxysmal atrial fibrillation) ([place]) CHADS VASc=3 .   However, his only fibrillation was when he was acutely septic. I see no other dysrhythmia. He is at high risk for bleed with potential for falls. Therefore, we will stop his anticoagulation.  NEURO:  He followed up with neurology.  An MRI was done with results reported but I told the patient that I will defer to neurology to discuss with him.     Current medicines are reviewed at length with the patient today.  The patient does not have concerns regarding medicines.  The following changes have been made:  As above  Labs/ tests ordered today include:   No orders of the defined types were placed in this encounter.     Disposition:   FU with me in six months.   Signed, Minus Breeding, MD  02/03/2016 2:50 PM    [facility]

## 2016-02-03 NOTE — Patient Instructions (Signed)
Medication Instructions: Dr Antoine Poche has recommended making the following medication changes: INCREASE Lisinopril to 12.5 mg twice daily  >>A prescription for the 2.5 mg tablet has been printed  Labwork: Your physician recommends that you return for lab work in 10 DAYS.  Testing/Procedures: NONE  Follow-up: Dr Antoine Poche recommends that you schedule a follow-up appointment in 2 months.  If you need a refill on your cardiac medications before your next appointment, please call your pharmacy.

## 2016-02-08 ENCOUNTER — Telehealth: Payer: Self-pay | Admitting: Neurology

## 2016-02-08 NOTE — Telephone Encounter (Signed)
Pls see Result Note, thanks!

## 2016-02-08 NOTE — Telephone Encounter (Signed)
Pt son Yousof called and wants the results of the MRI please call [phone]

## 2016-02-08 NOTE — Telephone Encounter (Signed)
Please review

## 2016-02-09 NOTE — Telephone Encounter (Signed)
Lmovm to rtn my call. 

## 2016-02-09 NOTE — Telephone Encounter (Signed)
-----   Message from Van Clines, MD sent at 02/08/2016  5:22 PM EST ----- Pls let son know I reviewed MRI brain, it shows age-related changes, no tumor, stroke, or bleed. Thanks

## 2016-02-14 NOTE — Telephone Encounter (Signed)
Lmovm to rtn my call. 

## 2016-02-19 ENCOUNTER — Emergency Department (HOSPITAL_COMMUNITY): Payer: Medicare Other

## 2016-02-19 ENCOUNTER — Encounter (HOSPITAL_COMMUNITY): Payer: Self-pay | Admitting: Emergency Medicine

## 2016-02-19 ENCOUNTER — Inpatient Hospital Stay (HOSPITAL_COMMUNITY)
Admission: EM | Admit: 2016-02-19 | Discharge: 2016-02-25 | DRG: 391 | Disposition: A | Discharge status: Home Health | Payer: Medicare Other | Attending: Internal Medicine | Admitting: Internal Medicine

## 2016-02-19 DIAGNOSIS — E860 Dehydration: Secondary | ICD-10-CM | POA: Diagnosis present

## 2016-02-19 DIAGNOSIS — A0819 Acute gastroenteropathy due to other small round viruses: Secondary | ICD-10-CM | POA: Diagnosis not present

## 2016-02-19 DIAGNOSIS — I5043 Acute on chronic combined systolic (congestive) and diastolic (congestive) heart failure: Secondary | ICD-10-CM | POA: Diagnosis not present

## 2016-02-19 DIAGNOSIS — Z6824 Body mass index (BMI) 24.0-24.9, adult: Secondary | ICD-10-CM

## 2016-02-19 DIAGNOSIS — Z87891 Personal history of nicotine dependence: Secondary | ICD-10-CM

## 2016-02-19 DIAGNOSIS — R197 Diarrhea, unspecified: Secondary | ICD-10-CM | POA: Diagnosis present

## 2016-02-19 DIAGNOSIS — I4891 Unspecified atrial fibrillation: Secondary | ICD-10-CM | POA: Diagnosis present

## 2016-02-19 DIAGNOSIS — E440 Moderate protein-calorie malnutrition: Secondary | ICD-10-CM | POA: Diagnosis present

## 2016-02-19 DIAGNOSIS — R001 Bradycardia, unspecified: Secondary | ICD-10-CM | POA: Diagnosis not present

## 2016-02-19 DIAGNOSIS — F0390 Unspecified dementia without behavioral disturbance: Secondary | ICD-10-CM | POA: Diagnosis present

## 2016-02-19 DIAGNOSIS — K5900 Constipation, unspecified: Secondary | ICD-10-CM | POA: Diagnosis not present

## 2016-02-19 DIAGNOSIS — I5042 Chronic combined systolic (congestive) and diastolic (congestive) heart failure: Secondary | ICD-10-CM | POA: Diagnosis not present

## 2016-02-19 DIAGNOSIS — Z808 Family history of malignant neoplasm of other organs or systems: Secondary | ICD-10-CM

## 2016-02-19 DIAGNOSIS — R9431 Abnormal electrocardiogram [ECG] [EKG]: Secondary | ICD-10-CM | POA: Insufficient documentation

## 2016-02-19 DIAGNOSIS — R112 Nausea with vomiting, unspecified: Secondary | ICD-10-CM | POA: Diagnosis present

## 2016-02-19 DIAGNOSIS — R1110 Vomiting, unspecified: Secondary | ICD-10-CM | POA: Insufficient documentation

## 2016-02-19 DIAGNOSIS — Z833 Family history of diabetes mellitus: Secondary | ICD-10-CM

## 2016-02-19 DIAGNOSIS — I255 Ischemic cardiomyopathy: Secondary | ICD-10-CM | POA: Diagnosis present

## 2016-02-19 DIAGNOSIS — I429 Cardiomyopathy, unspecified: Secondary | ICD-10-CM

## 2016-02-19 DIAGNOSIS — I10 Essential (primary) hypertension: Secondary | ICD-10-CM | POA: Diagnosis present

## 2016-02-19 DIAGNOSIS — F03A0 Unspecified dementia, mild, without behavioral disturbance, psychotic disturbance, mood disturbance, and anxiety: Secondary | ICD-10-CM | POA: Diagnosis present

## 2016-02-19 DIAGNOSIS — I110 Hypertensive heart disease with heart failure: Secondary | ICD-10-CM | POA: Diagnosis present

## 2016-02-19 DIAGNOSIS — I5041 Acute combined systolic (congestive) and diastolic (congestive) heart failure: Secondary | ICD-10-CM | POA: Diagnosis present

## 2016-02-19 DIAGNOSIS — N179 Acute kidney failure, unspecified: Secondary | ICD-10-CM | POA: Diagnosis present

## 2016-02-19 HISTORY — DX: Personal history of other diseases of the digestive system: Z87.19

## 2016-02-19 HISTORY — DX: Unspecified dementia, unspecified severity, without behavioral disturbance, psychotic disturbance, mood disturbance, and anxiety: F03.90

## 2016-02-19 HISTORY — DX: Gastro-esophageal reflux disease without esophagitis: K21.9

## 2016-02-19 LAB — COMPREHENSIVE METABOLIC PANEL
ALBUMIN: 3.8 g/dL (ref 3.5–5.0)
ALK PHOS: 43 U/L (ref 38–126)
ALT: 19 U/L (ref 17–63)
AST: 29 U/L (ref 15–41)
Anion gap: 13 (ref 5–15)
BUN: 25 mg/dL — ABNORMAL HIGH (ref 6–20)
CALCIUM: 9.5 mg/dL (ref 8.9–10.3)
CHLORIDE: 101 mmol/L (ref 101–111)
CO2: 27 mmol/L (ref 22–32)
CREATININE: 1.41 mg/dL — AB (ref 0.61–1.24)
GFR calc Af Amer: 54 mL/min — ABNORMAL LOW (ref >60)
GFR calc non Af Amer: 46 mL/min — ABNORMAL LOW (ref >60)
GLUCOSE: 155 mg/dL — AB (ref 65–99)
Potassium: 4.1 mmol/L (ref 3.5–5.1)
SODIUM: 141 mmol/L (ref 135–145)
Total Bilirubin: 1 mg/dL (ref 0.3–1.2)
Total Protein: 6.5 g/dL (ref 6.5–8.1)

## 2016-02-19 LAB — CBC WITH DIFFERENTIAL/PLATELET
BASOS PCT: 0 %
Basophils Absolute: 0 10*3/uL (ref 0.0–0.1)
EOS ABS: 0 10*3/uL (ref 0.0–0.7)
Eosinophils Relative: 0 %
HEMATOCRIT: 49.9 % (ref 39.0–52.0)
Hemoglobin: 16.9 g/dL (ref 13.0–17.0)
LYMPHS ABS: 0.3 10*3/uL — AB (ref 0.7–4.0)
Lymphocytes Relative: 3 %
MCH: 31.4 pg (ref 26.0–34.0)
MCHC: 33.9 g/dL (ref 30.0–36.0)
MCV: 92.6 fL (ref 78.0–100.0)
MONO ABS: 0.7 10*3/uL (ref 0.1–1.0)
MONOS PCT: 6 %
Neutro Abs: 10.4 10*3/uL — ABNORMAL HIGH (ref 1.7–7.7)
Neutrophils Relative %: 91 %
Platelets: 194 10*3/uL (ref 150–400)
RBC: 5.39 MIL/uL (ref 4.22–5.81)
RDW: 13.4 % (ref 11.5–15.5)
WBC: 11.4 10*3/uL — ABNORMAL HIGH (ref 4.0–10.5)

## 2016-02-19 LAB — I-STAT CHEM 8, ED
BUN: 26 mg/dL — AB (ref 6–20)
CALCIUM ION: 1.13 mmol/L (ref 1.13–1.30)
CREATININE: 1.2 mg/dL (ref 0.61–1.24)
Chloride: 102 mmol/L (ref 101–111)
Glucose, Bld: 149 mg/dL — ABNORMAL HIGH (ref 65–99)
HEMATOCRIT: 54 % — AB (ref 39.0–52.0)
HEMOGLOBIN: 18.4 g/dL — AB (ref 13.0–17.0)
Potassium: 4 mmol/L (ref 3.5–5.1)
SODIUM: 142 mmol/L (ref 135–145)
TCO2: 26 mmol/L (ref 0–100)

## 2016-02-19 LAB — URINALYSIS, ROUTINE W REFLEX MICROSCOPIC
GLUCOSE, UA: NEGATIVE mg/dL
Ketones, ur: 15 mg/dL — AB
LEUKOCYTES UA: NEGATIVE
Nitrite: NEGATIVE
PH: 5.5 (ref 5.0–8.0)
PROTEIN: NEGATIVE mg/dL
Specific Gravity, Urine: 1.027 (ref 1.005–1.030)

## 2016-02-19 LAB — GLUCOSE, CAPILLARY
GLUCOSE-CAPILLARY: 131 mg/dL — AB (ref 65–99)
GLUCOSE-CAPILLARY: 105 mg/dL — AB (ref 65–99)

## 2016-02-19 LAB — URINE MICROSCOPIC-ADD ON: Squamous Epithelial / LPF: NONE SEEN

## 2016-02-19 LAB — I-STAT TROPONIN, ED: Troponin i, poc: 0.07 ng/mL (ref 0.00–0.08)

## 2016-02-19 LAB — MAGNESIUM: MAGNESIUM: 2 mg/dL (ref 1.7–2.4)

## 2016-02-19 MED ORDER — HYDROCODONE-ACETAMINOPHEN 5-325 MG PO TABS: 1.0000 | ORAL_TABLET | ORAL | Status: DC | PRN

## 2016-02-19 MED ORDER — MORPHINE SULFATE (PF) 2 MG/ML IV SOLN: 1.0000 mg | INTRAVENOUS | Status: DC | PRN

## 2016-02-19 MED ORDER — CARVEDILOL 6.25 MG PO TABS
7.7500 mg | ORAL_TABLET | Freq: Two times a day (BID) | ORAL | Status: DC
  Administered 2016-02-19 – 2016-02-20 (×3): 7.8125 mg via ORAL
  Filled 2016-02-19 (×3): qty 1

## 2016-02-19 MED ORDER — POLYETHYLENE GLYCOL 3350 17 G PO PACK: 17.0000 g | PACK | Freq: Every day | ORAL | Status: DC | PRN

## 2016-02-19 MED ORDER — LISINOPRIL 2.5 MG PO TABS: 2.5000 mg | ORAL_TABLET | Freq: Two times a day (BID) | ORAL | Status: DC

## 2016-02-19 MED ORDER — SODIUM CHLORIDE 0.9 % IV BOLUS (SEPSIS)
500.0000 mL | Freq: Once | INTRAVENOUS | Status: AC
  Administered 2016-02-19: 500 mL via INTRAVENOUS

## 2016-02-19 MED ORDER — INSULIN ASPART 100 UNIT/ML ~~LOC~~ SOLN
0.0000 [IU] | Freq: Every day | SUBCUTANEOUS | Status: DC
Start: 2016-02-19 — End: 2016-02-26

## 2016-02-19 MED ORDER — ACETAMINOPHEN 325 MG PO TABS
650.0000 mg | ORAL_TABLET | Freq: Four times a day (QID) | ORAL | Status: DC | PRN
Start: 2016-02-19 — End: 2016-02-26
  Administered 2016-02-23 – 2016-02-25 (×2): 650 mg via ORAL
  Filled 2016-02-19 (×2): qty 2

## 2016-02-19 MED ORDER — INSULIN ASPART 100 UNIT/ML ~~LOC~~ SOLN
0.0000 [IU] | Freq: Three times a day (TID) | SUBCUTANEOUS | Status: DC
  Administered 2016-02-19: 1 [IU] via SUBCUTANEOUS

## 2016-02-19 MED ORDER — ACETAMINOPHEN 650 MG RE SUPP: 650.0000 mg | Freq: Four times a day (QID) | RECTAL | Status: DC | PRN

## 2016-02-19 MED ORDER — SODIUM CHLORIDE 0.9 % IV SOLN
INTRAVENOUS | Status: AC
  Administered 2016-02-19: 15:00:00 via INTRAVENOUS

## 2016-02-19 MED ORDER — FUROSEMIDE 20 MG PO TABS: 20.0000 mg | ORAL_TABLET | Freq: Every day | ORAL | Status: DC

## 2016-02-19 MED ORDER — LISINOPRIL 10 MG PO TABS
10.0000 mg | ORAL_TABLET | Freq: Two times a day (BID) | ORAL | Status: DC
  Administered 2016-02-19 – 2016-02-25 (×12): 10 mg via ORAL
  Filled 2016-02-19 (×12): qty 1

## 2016-02-19 MED ORDER — ONDANSETRON HCL 4 MG/2ML IJ SOLN
4.0000 mg | INTRAMUSCULAR | Status: AC
  Administered 2016-02-19 (×3): 4 mg via INTRAVENOUS
  Filled 2016-02-19 (×4): qty 2

## 2016-02-19 MED ORDER — ASPIRIN EC 81 MG PO TBEC
81.0000 mg | DELAYED_RELEASE_TABLET | Freq: Every day | ORAL | Status: DC
  Administered 2016-02-19 – 2016-02-25 (×7): 81 mg via ORAL
  Filled 2016-02-19 (×7): qty 1

## 2016-02-19 MED ORDER — ENOXAPARIN SODIUM 40 MG/0.4ML ~~LOC~~ SOLN
40.0000 mg | SUBCUTANEOUS | Status: DC
Start: 2016-02-19 — End: 2016-02-26
  Administered 2016-02-19 – 2016-02-24 (×5): 40 mg via SUBCUTANEOUS
  Filled 2016-02-19 (×6): qty 0.4

## 2016-02-19 NOTE — ED Notes (Signed)
Attempted to collect urine, pt unable to void at this time. 

## 2016-02-19 NOTE — ED Notes (Signed)
Patient undressed, in gown, on monitor, continuous pulse oximetry and blood pressure cuff 

## 2016-02-19 NOTE — ED Notes (Signed)
Pt unable to tolerate PO intake. Pt vomited about 30 minutes after drinking water.

## 2016-02-19 NOTE — ED Notes (Signed)
Per EMS, pt coming from [facility] nursing home for nausea, vomiting and generalized weakness. Pt alert to baseline per staff. Pt has abnormal EKG findings per EMS. Vitals WNL. NAD at this time.

## 2016-02-19 NOTE — H&P (Signed)
[facility] History and Physical  Mark Mullen ZOX:[id number] DOB: April 06, 1935 DOA: 02/19/2016  Referring physician: Madilyn Mullen PCP: Mark Jenny, MD   Chief Complaint: nausea/vomiting/diarrhea  HPI: Mark Mullen is a 81 y.o. male with a past medical history that includes hypertension, A. fib, CHF with an EF 30-35% presents to the emergency department with the chief complaint of persistent nausea vomiting and diarrhea. Information is obtained from the patient and the chart. Initial evaluation in the emergency department reveals abnormal EKG mild dehydration related to continued vomiting.  Information obtained from the patient and may be somewhat unreliable can see does have some memory issues. He reports diarrhea for 5-6 days. States he has 5 or 6 stools per day that are quite watery light brown. He denies abdominal pain or abdominal distention. He denies any recent antibiotics. 2 days ago he developed nausea with intermittent vomiting. He denies coffee ground emesis. Associated symptoms include decreased oral intake, headache, rigors and generalized weakness.Marland Kitchen He denies dizziness visual disturbances syncope or near-syncope. He denies dysuria hematuria frequency or urgency. He denies chest pain palpitations shortness of breath.  Emergency department is afebrile hemodynamically stable and not hypoxic. Provided with IV fluids bout to be discharged when vomiting reoccurred.   Review of Systems:  10 point review of systems complete and all systems negative except as indicated in the history of present illness  Past Medical History  Diagnosis Date  . CHF (congestive heart failure) (H[facility])   . Hypertension   . Atrial fibrillation ([facility])   . Alcohol abuse    Past Surgical History  Procedure Laterality Date  . Tonsillectomy     Social History:  reports that he quit smoking about 5 months ago. His smoking use included Cigarettes. He has never used smokeless tobacco. He reports that he does not drink  alcohol or use illicit drugs. He is a resident of [address] assisted living. No Known Allergies  Family History  Problem Relation Age of Onset  . Brain cancer Mother   . Aneurysm Father   . Diabetes Sister   . Diabetes Brother      Prior to Admission medications   Medication Sig Start Date End Date Taking? Authorizing Provider  Ascorbic Acid (VITAMIN C PO) Take 500 mg by mouth daily.    Yes Historical Provider, MD  aspirin EC 81 MG tablet Take 81 mg by mouth daily.   Yes Historical Provider, MD  carvedilol (COREG) 6.25 MG tablet Take 7.75 mg by mouth 2 (two) times daily.  12/24/15  Yes Historical Provider, MD  Cholecalciferol (VITAMIN D PO) Take 1,000 Units by mouth daily.    Yes Historical Provider, MD  donepezil (ARICEPT) 10 MG tablet Take 1/2 tablet daily for 1 month, then increase to 1 tablet daily and continue Patient taking differently: Take 10 mg by mouth at bedtime.  01/19/16  Yes Van Clines, MD  furosemide (LASIX) 20 MG tablet Take 1 tablet (20 mg total) by mouth daily. 12/03/15  Yes Rollene Rotunda, MD  lisinopril (PRINIVIL,ZESTRIL) 10 MG tablet Take 1 tablet (10 mg total) by mouth 2 (two) times daily. 10/28/15  Yes Rollene Rotunda, MD  lisinopril (PRINIVIL,ZESTRIL) 2.5 MG tablet Take 1 tablet (2.5 mg total) by mouth 2 (two) times daily. To take in addition to the 10 mg tablet 02/03/16  Yes Rollene Rotunda, MD   Physical Exam: Filed Vitals:   02/19/16 1300 02/19/16 1400 02/19/16 1432 02/19/16 1432  BP: 142/60  130/82   Pulse:  65 65  Temp:    100.7 F (38.2 C)  TempSrc:    Rectal  Resp:  20 18   SpO2: 95% 93% 97%     Wt Readings from Last 3 Encounters:  02/03/16 71.895 kg (158 lb 8 oz)  01/19/16 71.215 kg (157 lb)  12/03/15 65.908 kg (145 lb 4.8 oz)    General:  Appears calm and comfortable, somewhat thin and frail Eyes: PERRL, normal lids, irises & conjunctiva ENT: grossly normal hearing, mucus membranes pink and dry  Cardiovascular: RRR, no m/r/g. No LE  edema.  Respiratory: CTA bilaterally, no w/r/r. Normal respiratory effort. Abdomen: soft, ntnd Positive bowel sounds no guarding or rebounding Skin: no rash or induration seen on limited exam Musculoskeletal: grossly normal tone BUE/BLE Psychiatric: grossly normal mood and affect, speech fluent and appropriate Neurologic: grossly non-focal. Speech clear facial symmetry follows commands is able to make once a needs known does have memory deficit           Labs on Admission:  Basic Metabolic Panel:  Recent Labs Lab 02/19/16 0923 02/19/16 0931  NA 141 142  K 4.1 4.0  CL 101 102  CO2 27  --   GLUCOSE 155* 149*  BUN 25* 26*  CREATININE 1.41* 1.20  CALCIUM 9.5  --   MG 2.0  --    Liver Function Tests:  Recent Labs Lab 02/19/16 0923  AST 29  ALT 19  ALKPHOS 43  BILITOT 1.0  PROT 6.5  ALBUMIN 3.8   No results for input(s): LIPASE, AMYLASE in the last 168 hours. No results for input(s): AMMONIA in the last 168 hours. CBC:  Recent Labs Lab 02/19/16 0923 02/19/16 0931  WBC 11.4*  --   NEUTROABS 10.4*  --   HGB 16.9 18.4*  HCT 49.9 54.0*  MCV 92.6  --   PLT 194  --    Cardiac Enzymes: No results for input(s): CKTOTAL, CKMB, CKMBINDEX, TROPONINI in the last 168 hours.  BNP (last 3 results)  Recent Labs  09/13/15 1650  BNP 1302.1*    ProBNP (last 3 results) No results for input(s): PROBNP in the last 8760 hours.  CBG: No results for input(s): GLUCAP in the last 168 hours.  Radiological Exams on Admission: Dg Chest Port 1 View  02/19/2016  CLINICAL DATA:  Nausea vomiting and generalized weakness with right-sided arm pain and history of atrial fibrillation and congestive heart failure EXAM: PORTABLE CHEST 1 VIEW COMPARISON:  09/13/2015 FINDINGS: Mild cardiac silhouette enlargement stable. Vascular pattern normal. Elevation of the right diaphragm stable. Lungs clear. Calcified left hilar lymph nodes unchanged. IMPRESSION: No active disease. Electronically  Signed   By: Esperanza Heir M.D.   On: 02/19/2016 09:42    EKG: Independently reviewed. Sinus rhythm Prolonged PR interval Right bundle branch block Repol abnrm  Assessment/Plan Principal Problem:   Intractable nausea and vomiting Active Problems:   Atrial fibrillation, unspecified   Cardiomyopathy - etiology not yet determined   Mild dementia   Essential hypertension   Diarrhea   Chronic combined systolic and diastolic heart failure (HCC)  #1. Intractable nausea and vomiting. Likely viral gastroenteritis. Patient asked temp 100.7 rectally, nontoxic appearing. Exam benign. Mild leukocytosis. No abdominal pain.  -Admit to telemetry -Bowel rest -Scheduled Zofran -Gentle IV fluids -Advance diet as tolerated  #2. Persistent diarrhea. No recent antibiotics per patient. -C. difficile PCR -GI pathogen panel -Clear liquids -Gentle IV fluids  #3. A. Fib. chadvasc score 3. Review indicates cardiology opines his only fibrillation was when  he was acutely septic. No other dysrhythmia noted. A candidate for anticoagulation  EKG with sinus rhythm prolonged PR interval and RRR.  Denies chest pain. -Continue home beta blocker  #4. Chronic combined systolic and diastolic heart failure. He sent echo with 35% EF grade 2 diastolic dysfunction. Patient appears dehydrated.Saw cardiologist recently. Note indicates recent stress perfusion study demonstrating inferior and inferior apical defect that was decided would be managed medically.  -Continue beta blocker -Gentle IV fluids -Monitor intake and output -Obtain daily weights -Lasix until tomorrow  5. Cardiomyopathy. Cardiology note dated February 2017 indicates perfusion study suggests could be ischemic. Patient remains asymptomatic. Presumably related to former EtOH abuse. Medical management -See #4 -Continue lisinopril  #6. Mild dementia. Recent MRI Moderate ventricular enlargement predominantly involving the lateral and third ventricles.  Although there is advanced atrophy, this raises concern for mild hydrocephalus which could be contributing to the patient's dementia. Mild periventricular T2 changes may represent chronic microvascular ischemia first is transependymal CSF flow in the setting of hydrocephalus. Neurology consult 01/19/2016. Please see note. MOCA score 19/30. Ears stable at baseline -continue home meds  Code Status: full DVT Prophylaxis: Family Communication: none present Disposition Plan: back to facility  Time spent: 46 minuters  [facility]    I have examined the patient, reviewed the chart and modified the above note which I agree with. He is being admitted for diarrhea and vomiting. Has a low grade fever and is dehydrated. EF 35% therefore will not be aggressive with IVF. He has received a 500 cc bolus and will continue 75 cc/hr for 10 hrs only. Has not made much urine in the ER. Lasix ordered to start tomorrow but can be discontinued if he is still dehydrated.   Abdominal exam benign. Clear liquids, symptomatic management, GI pathogen panel and C diff PCR ordered  Rexann Lueras,MD Pager # [email or web address] 02/19/2016, 3:17 PM

## 2016-02-19 NOTE — ED Notes (Signed)
Myself and Matt, RN performed a rectal temperature on patient; changed patient's stretcher linens for patient had a small amount of diahrrea; cleaned patient, applied clean stretcher linens and placed multiple chuks underneath; patient resting at this time

## 2016-02-19 NOTE — ED Provider Notes (Signed)
CSN: [id number]     Arrival date & time 02/19/16  0856 History   First MD Initiated Contact with Patient 02/19/16 [id number]     Chief Complaint  Patient presents with  . Emesis     Patient is a 81 y.o. male presenting with vomiting. The history is provided by the patient. No language interpreter was used.  Emesis  Lunsford Benner is a 81 y.o. male who presents to the Emergency Department complaining of vomiting.  Level V Caveat due to dementia.  History is provided by the patient and EMS. Patient reports that he developed nausea, vomiting, severe weakness today. No hematemesis. No diarrhea, normal pain, chest pain. Per EMS he was weak and sat down on the floor. They state that he did report chest pain one point but he denies this. Symptoms are moderate, constant and worsening.  Past Medical History  Diagnosis Date  . CHF (congestive heart failure) (HCC)   . Hypertension   . Atrial fibrillation (H[facility])   . Alcohol abuse    Past Surgical History  Procedure Laterality Date  . Tonsillectomy     Family History  Problem Relation Age of Onset  . Brain cancer Mother   . Aneurysm Father   . Diabetes Sister   . Diabetes Brother    Social History  Substance Use Topics  . Smoking status: Former Smoker    Types: Cigarettes    Quit date: 09/13/2015  . Smokeless tobacco: Never Used  . Alcohol Use: No     Comment: former daily user    Review of Systems  Gastrointestinal: Positive for vomiting.  All other systems reviewed and are negative.     Allergies  Review of patient's allergies indicates no known allergies.  Home Medications   Prior to Admission medications   Medication Sig Start Date End Date Taking? Authorizing Provider  Ascorbic Acid (VITAMIN C PO) Take 500 mg by mouth daily.    Yes Historical Provider, MD  aspirin EC 81 MG tablet Take 81 mg by mouth daily.   Yes Historical Provider, MD  carvedilol (COREG) 6.25 MG tablet Take 7.75 mg by mouth 2 (two) times daily.  12/24/15  Yes  Historical Provider, MD  Cholecalciferol (VITAMIN D PO) Take 1,000 Units by mouth daily.    Yes Historical Provider, MD  donepezil (ARICEPT) 10 MG tablet Take 1/2 tablet daily for 1 month, then increase to 1 tablet daily and continue Patient taking differently: Take 10 mg by mouth at bedtime.  01/19/16  Yes Van Clines, MD  furosemide (LASIX) 20 MG tablet Take 1 tablet (20 mg total) by mouth daily. 12/03/15  Yes Rollene Rotunda, MD  lisinopril (PRINIVIL,ZESTRIL) 10 MG tablet Take 1 tablet (10 mg total) by mouth 2 (two) times daily. 10/28/15  Yes Rollene Rotunda, MD  lisinopril (PRINIVIL,ZESTRIL) 2.5 MG tablet Take 1 tablet (2.5 mg total) by mouth 2 (two) times daily. To take in addition to the 10 mg tablet 02/03/16  Yes Rollene Rotunda, MD   BP 118/66 mmHg  Pulse 75  Temp(Src) 99.4 F (37.4 C) (Oral)  Resp 18  SpO2 93% Physical Exam  Constitutional: He appears well-developed and well-nourished.  HENT:  Head: Normocephalic and atraumatic.  Cardiovascular: Normal rate and regular rhythm.   No murmur heard. Pulmonary/Chest: Effort normal and breath sounds normal. No respiratory distress.  Abdominal: Soft. There is no tenderness. There is no rebound and no guarding.  Musculoskeletal: He exhibits no edema or tenderness.  Neurological: He is alert.  Mildly confused, MAE symmetrically  Skin: Skin is warm and dry.  Psychiatric:  Pleasant and joking with staff  Nursing note and vitals reviewed.   ED Course  Procedures (including critical care time) Labs Review Labs Reviewed  COMPREHENSIVE METABOLIC PANEL - Abnormal; Notable for the following:    Glucose, Bld 155 (*)    BUN 25 (*)    Creatinine, Ser 1.41 (*)    GFR calc non Af Amer 46 (*)    GFR calc Af Amer 54 (*)    All other components within normal limits  CBC WITH DIFFERENTIAL/PLATELET - Abnormal; Notable for the following:    WBC 11.4 (*)    Neutro Abs 10.4 (*)    Lymphs Abs 0.3 (*)    All other components within normal limits   URINALYSIS, ROUTINE W REFLEX MICROSCOPIC (NOT AT [facility]) - Abnormal; Notable for the following:    Hgb urine dipstick SMALL (*)    Bilirubin Urine SMALL (*)    Ketones, ur 15 (*)    All other components within normal limits  URINE MICROSCOPIC-ADD ON - Abnormal; Notable for the following:    Bacteria, UA RARE (*)    All other components within normal limits  I-STAT CHEM 8, ED - Abnormal; Notable for the following:    BUN 26 (*)    Glucose, Bld 149 (*)    Hemoglobin 18.4 (*)    HCT 54.0 (*)    All other components within normal limits  URINE CULTURE  C DIFFICILE QUICK SCREEN W PCR REFLEX  GASTROINTESTINAL PANEL BY PCR, STOOL (REPLACES STOOL CULTURE)  MAGNESIUM  HEMOGLOBIN A1C  I-STAT TROPOININ, ED    Imaging Review Dg Chest Port 1 View  02/19/2016  CLINICAL DATA:  Nausea vomiting and generalized weakness with right-sided arm pain and history of atrial fibrillation and congestive heart failure EXAM: PORTABLE CHEST 1 VIEW COMPARISON:  09/13/2015 FINDINGS: Mild cardiac silhouette enlargement stable. Vascular pattern normal. Elevation of the right diaphragm stable. Lungs clear. Calcified left hilar lymph nodes unchanged. IMPRESSION: No active disease. Electronically Signed   By: Esperanza Heir M.D.   On: 02/19/2016 09:42   I have personally reviewed and evaluated these images and lab results as part of my medical decision-making.   EKG Interpretation   Date/Time:  Saturday February 19 2016 11:06:05 EST Ventricular Rate:  82 PR Interval:  252 QRS Duration: 187 QT Interval:  407 QTC Calculation: 475 R Axis:   99 Text Interpretation:  Sinus rhythm Prolonged PR interval Right bundle  branch block Repol abnrm, global ischemia, diffuse leads Confirmed by  Lincoln Brigham [phone]) on 02/19/2016 12:35:34 PM      MDM   Final diagnoses:  None    Patient here for evaluation of nausea, vomiting, generalized weakness. On ED arrival patient was relatively symptom-free. He does have an EKG that is  abnormal but similar to priors. On repeat evaluation patient with recurrent vomiting and feeling poorly, difficulty with ambulation as well as diarrhea. Do not feel that patient is well enough to discharge back to the facility given his recurrent symptoms. Plan to admit for observation for further monitoring.    Tilden Fossa, MD 02/19/16 1610

## 2016-02-19 NOTE — ED Notes (Signed)
Ambulated pt, pt ambulated with one staff assist. Pt reported that he felt weaker than usual. Pt also had loose bowel movement as well. MD notified.

## 2016-02-20 DIAGNOSIS — I5042 Chronic combined systolic (congestive) and diastolic (congestive) heart failure: Secondary | ICD-10-CM | POA: Diagnosis not present

## 2016-02-20 DIAGNOSIS — I10 Essential (primary) hypertension: Secondary | ICD-10-CM | POA: Diagnosis not present

## 2016-02-20 DIAGNOSIS — I4891 Unspecified atrial fibrillation: Secondary | ICD-10-CM | POA: Diagnosis not present

## 2016-02-20 LAB — GASTROINTESTINAL PANEL BY PCR, STOOL (REPLACES STOOL CULTURE)
ASTROVIRUS: NOT DETECTED
Adenovirus F40/41: NOT DETECTED
CYCLOSPORA CAYETANENSIS: NOT DETECTED
Campylobacter species: NOT DETECTED
Cryptosporidium: NOT DETECTED
E. coli O157: NOT DETECTED
ENTAMOEBA HISTOLYTICA: NOT DETECTED
ENTEROAGGREGATIVE E COLI (EAEC): NOT DETECTED
Enteropathogenic E coli (EPEC): NOT DETECTED
Enterotoxigenic E coli (ETEC): NOT DETECTED
GIARDIA LAMBLIA: NOT DETECTED
NOROVIRUS GI/GII: DETECTED — AB
Plesimonas shigelloides: NOT DETECTED
Rotavirus A: NOT DETECTED
SALMONELLA SPECIES: NOT DETECTED
Sapovirus (I, II, IV, and V): NOT DETECTED
Shiga like toxin producing E coli (STEC): NOT DETECTED
Shigella/Enteroinvasive E coli (EIEC): NOT DETECTED
VIBRIO CHOLERAE: NOT DETECTED
VIBRIO SPECIES: NOT DETECTED
Yersinia enterocolitica: NOT DETECTED

## 2016-02-20 LAB — URINE CULTURE: Culture: NO GROWTH

## 2016-02-20 LAB — C DIFFICILE QUICK SCREEN W PCR REFLEX
C DIFFICLE (CDIFF) ANTIGEN: POSITIVE — AB
C Diff toxin: NEGATIVE

## 2016-02-20 LAB — BASIC METABOLIC PANEL
Anion gap: 9 (ref 5–15)
BUN: 21 mg/dL — AB (ref 6–20)
CHLORIDE: 106 mmol/L (ref 101–111)
CO2: 26 mmol/L (ref 22–32)
CREATININE: 1.38 mg/dL — AB (ref 0.61–1.24)
Calcium: 8.3 mg/dL — ABNORMAL LOW (ref 8.9–10.3)
GFR calc Af Amer: 55 mL/min — ABNORMAL LOW (ref >60)
GFR calc non Af Amer: 47 mL/min — ABNORMAL LOW (ref >60)
GLUCOSE: 120 mg/dL — AB (ref 65–99)
Potassium: 3.8 mmol/L (ref 3.5–5.1)
Sodium: 141 mmol/L (ref 135–145)

## 2016-02-20 LAB — CBC
HCT: 43.2 % (ref 39.0–52.0)
Hemoglobin: 13.5 g/dL (ref 13.0–17.0)
MCH: 29.3 pg (ref 26.0–34.0)
MCHC: 31.3 g/dL (ref 30.0–36.0)
MCV: 93.9 fL (ref 78.0–100.0)
PLATELETS: 151 10*3/uL (ref 150–400)
RBC: 4.6 MIL/uL (ref 4.22–5.81)
RDW: 13.5 % (ref 11.5–15.5)
WBC: 7.5 10*3/uL (ref 4.0–10.5)

## 2016-02-20 LAB — GLUCOSE, CAPILLARY
GLUCOSE-CAPILLARY: 126 mg/dL — AB (ref 65–99)
Glucose-Capillary: 99 mg/dL (ref 65–99)
Glucose-Capillary: 91 mg/dL (ref 65–99)
Glucose-Capillary: 108 mg/dL — ABNORMAL HIGH (ref 65–99)

## 2016-02-20 MED ORDER — SODIUM CHLORIDE 0.9 % IV SOLN
INTRAVENOUS | Status: DC
  Administered 2016-02-20 – 2016-02-21 (×3): via INTRAVENOUS

## 2016-02-20 NOTE — Evaluation (Addendum)
Physical Therapy Evaluation Patient Details Name: Mark Mullen MRN: [id number] DOB: 07-30-1935 Today's Date: 02/20/2016   History of Present Illness  81 yo admitted with intractable nausea, vomiting, diarrhea. PMHx: HTN, AFib, CHF  Clinical Impression  Pt very pleasant and willing to mobilize but limited by incontinent and frequent stools. Pt soiled on arrival and was able to get to the toilet to void x 2 during session with assist needed for pericare. Pt with decreased strength, gait and balance who will benefit from acute therapy to maximize mobility, function, gait and safety to decrease fall risk and burden of care. Pt without caregiver assist at home and needs to be Mod I for safe D/C. Pt also with concerning development of shuffling gait he reports having developed over the last few weeks. Recommend daily mobility with nursing assist.     Follow Up Recommendations Home health PT;Supervision for mobility/OOB    Equipment Recommendations  Rolling walker with 5" wheels    Recommendations for Other Services OT consult     Precautions / Restrictions Precautions Precautions: Fall Precaution Comments: incontinent stool      Mobility  Bed Mobility Overal bed mobility: Modified Independent             General bed mobility comments: increased time secondary to scrotal pain with transfer  Transfers Overall transfer level: Needs assistance   Transfers: Sit to/from Stand Sit to Stand: Supervision         General transfer comment: for safety  Ambulation/Gait Ambulation/Gait assistance: Min guard Ambulation Distance (Feet): 80 Feet Assistive device: 1 person hand held assist Gait Pattern/deviations: Shuffle;Trunk flexed;Narrow base of support   Gait velocity interpretation: Below normal speed for age/gender General Gait Details: pt with short shuffling steps and states this has become his pattern lately. Gait limited by incontinent stool  Stairs             Wheelchair Mobility    Modified Rankin (Stroke Patients Only)       Balance Overall balance assessment: Needs assistance   Sitting balance-Leahy Scale: Good       Standing balance-Leahy Scale: Good                               Pertinent Vitals/Pain Pain Assessment: No/denies pain    Home Living Family/patient expects to be discharged to:: Private residence Living Arrangements: Alone   Type of Home: House Home Access: Stairs to enter   Secretary/administrator of Steps: 4 Home Layout: One level Home Equipment: Cane - single point      Prior Function Level of Independence: Independent               Hand Dominance        Extremity/Trunk Assessment   Upper Extremity Assessment: Generalized weakness           Lower Extremity Assessment: Generalized weakness      Cervical / Trunk Assessment: Normal  Communication   Communication: No difficulties  Cognition Arousal/Alertness: Awake/alert Behavior During Therapy: WFL for tasks assessed/performed Overall Cognitive Status: Within Functional Limits for tasks assessed                      General Comments      Exercises        Assessment/Plan    PT Assessment Patient needs continued PT services  PT Diagnosis Difficulty walking;Generalized weakness   PT Problem List Decreased strength;Decreased activity tolerance;Decreased  balance;Decreased mobility;Pain;Decreased knowledge of use of DME;Decreased skin integrity  PT Treatment Interventions Gait training;Functional mobility training;Stair training;DME instruction;Therapeutic activities;Therapeutic exercise;Balance training;Patient/family education   PT Goals (Current goals can be found in the Care Plan section) Acute Rehab PT Goals Patient Stated Goal: return home PT Goal Formulation: With patient Time For Goal Achievement: 03/05/16 Potential to Achieve Goals: Good    Frequency Min 3X/week   Barriers to discharge  Decreased caregiver support      Co-evaluation               End of Session   Activity Tolerance: Patient tolerated treatment well Patient left: in chair;with call bell/phone within reach;with nursing/sitter in room Nurse Communication: Mobility status    Functional Assessment Tool Used: clinical judgement Functional Limitation: Mobility: Walking and moving around Mobility: Walking and Moving Around Current Status (U9323): At least 1 percent but less than 20 percent impaired, limited or restricted Mobility: Walking and Moving Around Goal Status [phone]): At least 1 percent but less than 20 percent impaired, limited or restricted    Time: 0814-0839 PT Time Calculation (min) (ACUTE ONLY): 25 min   Charges:   PT Evaluation $PT Eval Moderate Complexity: 1 Procedure     PT G Codes:   PT G-Codes **NOT FOR INPATIENT CLASS** Functional Assessment Tool Used: clinical judgement Functional Limitation: Mobility: Walking and moving around Mobility: Walking and Moving Around Current Status (K0254): At least 1 percent but less than 20 percent impaired, limited or restricted Mobility: Walking and Moving Around Goal Status [phone]): At least 1 percent but less than 20 percent impaired, limited or restricted    Delorse Lek 02/20/2016, 10:14 AM Delaney Meigs, PT [phone]

## 2016-02-20 NOTE — Progress Notes (Signed)
[facility] PROGRESS NOTE  Mark Mullen ZOX:[id number] DOB: May 16, 1935 DOA: 02/19/2016 PCP: Florentina Jenny, MD  HPI/Brief narrative 81 y.o. male with a past medical history that includes hypertension, A. fib, CHF with an EF 30-35% presents to the emergency department with the chief complaint of persistent nausea vomiting and diarrhea  Assessment/Plan: #1. Intractable nausea and vomiting. Likely viral gastroenteritis. Peak temp of 100.7 rectally, nontoxic appearing. Exam benign. Mild leukocytosis. No abdominal pain.  -Continued on IVF hydration -Patient reports diarrhea seems to be improving -cont to advance diet as tolerated  #2. Persistent diarrhea. No recent antibiotics per patient. -C. difficile PCR pos for antigen, neg for toxin suggesting colonization, not active infection -GI pathogen panel pending -Advancing diet -Gentle IV fluids  #3. A. Fib. chadvasc score 3. Review indicates cardiology opines his only fibrillation was when he was acutely septic. No other dysrhythmia noted. A candidate for anticoagulation EKG with sinus rhythm prolonged PR interval and RRR. Denies chest pain. -Continue home beta blocker  #4. Chronic combined systolic and diastolic heart failure. He sent echo with 35% EF grade 2 diastolic dysfunction. Patient appears dehydrated.Saw cardiologist recently. Note indicates recent stress perfusion study demonstrating inferior and inferior apical defect that was decided would be managed medically.  -Continue beta blocker -Gentle IV fluids -Monitor intake and output -Obtain daily weights  5. Cardiomyopathy. Cardiology note dated February 2017 indicates perfusion study suggests could be ischemic. Patient remains asymptomatic. Presumably related to former EtOH abuse. Medical management -See #4 -Continue lisinopril  #6. Mild dementia. Recent MRI Moderate ventricular enlargement predominantly involving the lateral and third ventricles. Although there is advanced  atrophy, this raises concern for mild hydrocephalus which could be contributing to the patient's dementia. Mild periventricular T2 changes may represent chronic microvascular ischemia first is transependymal CSF flow in the setting of hydrocephalus. Neurology consult 01/19/2016. Please see note. MOCA score 19/30. Ears stable at baseline -continue home meds  #7 Acute renal failure. Renal function worsened overnight. Continue IVF as tolerated.  Code Status: Full Family Communication: Pt in room Disposition Plan: Unclear at this time   Consultants:    Procedures:    Antibiotics: Anti-infectives    None      HPI/Subjective: Reports diarrhea seems better today  Objective: Filed Vitals:   02/20/16 0500 02/20/16 0521 02/20/16 0643 02/20/16 1432  BP:  140/63  118/51  Pulse:  69  72  Temp:  99 F (37.2 C)  98.4 F (36.9 C)  TempSrc:  Oral  Oral  Resp:  18  18  Weight: 72.576 kg (160 lb)  72.938 kg (160 lb 12.8 oz)   SpO2:  96%  99%    Intake/Output Summary (Last 24 hours) at 02/20/16 1530 Last data filed at 02/20/16 1300  Gross per 24 hour  Intake    480 ml  Output      0 ml  Net    480 ml   Filed Weights   02/20/16 0500 02/20/16 0643  Weight: 72.576 kg (160 lb) 72.938 kg (160 lb 12.8 oz)    Exam:   General:  Awake, in nad  Cardiovascular: regular, s1, s2  Respiratory: normal resp effort, no wheezing  Abdomen: soft, nondistended  Musculoskeletal: perfused, no clubbing   Data Reviewed: Basic Metabolic Panel:  Recent Labs Lab 02/19/16 0923 02/19/16 0931 02/20/16 0433  NA 141 142 141  K 4.1 4.0 3.8  CL 101 102 106  CO2 27  --  26  GLUCOSE 155* 149* 120*  BUN 25* 26*  21*  CREATININE 1.41* 1.20 1.38*  CALCIUM 9.5  --  8.3*  MG 2.0  --   --    Liver Function Tests:  Recent Labs Lab 02/19/16 0923  AST 29  ALT 19  ALKPHOS 43  BILITOT 1.0  PROT 6.5  ALBUMIN 3.8   No results for input(s): LIPASE, AMYLASE in the last 168 hours. No results  for input(s): AMMONIA in the last 168 hours. CBC:  Recent Labs Lab 02/19/16 0923 02/19/16 0931 02/20/16 0433  WBC 11.4*  --  7.5  NEUTROABS 10.4*  --   --   HGB 16.9 18.4* 13.5  HCT 49.9 54.0* 43.2  MCV 92.6  --  93.9  PLT 194  --  151   Cardiac Enzymes: No results for input(s): CKTOTAL, CKMB, CKMBINDEX, TROPONINI in the last 168 hours. BNP (last 3 results)  Recent Labs  09/13/15 1650  BNP 1302.1*    ProBNP (last 3 results) No results for input(s): PROBNP in the last 8760 hours.  CBG:  Recent Labs Lab 02/19/16 1627 02/19/16 2130 02/20/16 0753 02/20/16 1148  GLUCAP 131* 105* 126* 99    Recent Results (from the past 240 hour(s))  Urine culture     Status: None   Collection Time: 02/19/16 11:12 AM  Result Value Ref Range Status   Specimen Description URINE, CLEAN CATCH  Final   Special Requests NONE  Final   Culture NO GROWTH 1 DAY  Final   Report Status 02/20/2016 FINAL  Final  C difficile quick scan w PCR reflex     Status: Abnormal   Collection Time: 02/19/16  2:25 PM  Result Value Ref Range Status   C Diff antigen POSITIVE (A) NEGATIVE Final   C Diff toxin NEGATIVE NEGATIVE Final   C Diff interpretation   Final    C. difficile present, but toxin not detected. This indicates colonization. In most cases, this does not require treatment. If patient has signs and symptoms consistent with colitis, consider treatment. Requires ENTERIC precautions.     Studies: Dg Chest Port 1 View  02/19/2016  CLINICAL DATA:  Nausea vomiting and generalized weakness with right-sided arm pain and history of atrial fibrillation and congestive heart failure EXAM: PORTABLE CHEST 1 VIEW COMPARISON:  09/13/2015 FINDINGS: Mild cardiac silhouette enlargement stable. Vascular pattern normal. Elevation of the right diaphragm stable. Lungs clear. Calcified left hilar lymph nodes unchanged. IMPRESSION: No active disease. Electronically Signed   By: Esperanza Heir M.D.   On: 02/19/2016 09:42     Scheduled Meds: . aspirin EC  81 mg Oral Daily  . carvedilol  7.8125 mg Oral BID WC  . enoxaparin (LOVENOX) injection  40 mg Subcutaneous Q24H  . insulin aspart  0-5 Units Subcutaneous QHS  . insulin aspart  0-9 Units Subcutaneous TID WC  . lisinopril  10 mg Oral BID   Continuous Infusions: . sodium chloride 100 mL/hr at 02/20/16 1145    Principal Problem:   Intractable nausea and vomiting Active Problems:   Atrial fibrillation, unspecified   Cardiomyopathy - etiology not yet determined   Mild dementia   Essential hypertension   Diarrhea   Chronic combined systolic and diastolic heart failure (HCC)  CHIU, STEPHEN K  [facility] Pager [phone]. If 7PM-7AM, please contact night-coverage at [email or web address], password [email or web address] 02/20/2016, 3:30 PM

## 2016-02-21 DIAGNOSIS — I5041 Acute combined systolic (congestive) and diastolic (congestive) heart failure: Secondary | ICD-10-CM | POA: Diagnosis not present

## 2016-02-21 DIAGNOSIS — F0390 Unspecified dementia without behavioral disturbance: Secondary | ICD-10-CM | POA: Diagnosis present

## 2016-02-21 DIAGNOSIS — E860 Dehydration: Secondary | ICD-10-CM | POA: Diagnosis present

## 2016-02-21 DIAGNOSIS — Z87891 Personal history of nicotine dependence: Secondary | ICD-10-CM | POA: Diagnosis not present

## 2016-02-21 DIAGNOSIS — N179 Acute kidney failure, unspecified: Secondary | ICD-10-CM | POA: Diagnosis present

## 2016-02-21 DIAGNOSIS — Z808 Family history of malignant neoplasm of other organs or systems: Secondary | ICD-10-CM | POA: Diagnosis not present

## 2016-02-21 DIAGNOSIS — A0819 Acute gastroenteropathy due to other small round viruses: Secondary | ICD-10-CM | POA: Diagnosis present

## 2016-02-21 DIAGNOSIS — I429 Cardiomyopathy, unspecified: Secondary | ICD-10-CM | POA: Diagnosis not present

## 2016-02-21 DIAGNOSIS — I10 Essential (primary) hypertension: Secondary | ICD-10-CM | POA: Diagnosis not present

## 2016-02-21 DIAGNOSIS — K5900 Constipation, unspecified: Secondary | ICD-10-CM | POA: Diagnosis not present

## 2016-02-21 DIAGNOSIS — I5042 Chronic combined systolic (congestive) and diastolic (congestive) heart failure: Secondary | ICD-10-CM | POA: Diagnosis not present

## 2016-02-21 DIAGNOSIS — R112 Nausea with vomiting, unspecified: Secondary | ICD-10-CM | POA: Diagnosis present

## 2016-02-21 DIAGNOSIS — E440 Moderate protein-calorie malnutrition: Secondary | ICD-10-CM | POA: Diagnosis present

## 2016-02-21 DIAGNOSIS — I4891 Unspecified atrial fibrillation: Secondary | ICD-10-CM | POA: Diagnosis present

## 2016-02-21 DIAGNOSIS — Z6824 Body mass index (BMI) 24.0-24.9, adult: Secondary | ICD-10-CM | POA: Diagnosis not present

## 2016-02-21 DIAGNOSIS — I110 Hypertensive heart disease with heart failure: Secondary | ICD-10-CM | POA: Diagnosis present

## 2016-02-21 DIAGNOSIS — I5043 Acute on chronic combined systolic (congestive) and diastolic (congestive) heart failure: Secondary | ICD-10-CM | POA: Diagnosis not present

## 2016-02-21 DIAGNOSIS — I255 Ischemic cardiomyopathy: Secondary | ICD-10-CM | POA: Diagnosis present

## 2016-02-21 DIAGNOSIS — Z833 Family history of diabetes mellitus: Secondary | ICD-10-CM | POA: Diagnosis not present

## 2016-02-21 DIAGNOSIS — R001 Bradycardia, unspecified: Secondary | ICD-10-CM | POA: Diagnosis not present

## 2016-02-21 LAB — BASIC METABOLIC PANEL
Anion gap: 7 (ref 5–15)
BUN: 11 mg/dL (ref 6–20)
CALCIUM: 7.9 mg/dL — AB (ref 8.9–10.3)
CHLORIDE: 110 mmol/L (ref 101–111)
CO2: 24 mmol/L (ref 22–32)
CREATININE: 1.23 mg/dL (ref 0.61–1.24)
GFR calc non Af Amer: 54 mL/min — ABNORMAL LOW (ref >60)
GLUCOSE: 105 mg/dL — AB (ref 65–99)
Potassium: 3.8 mmol/L (ref 3.5–5.1)
Sodium: 141 mmol/L (ref 135–145)

## 2016-02-21 LAB — HEMOGLOBIN A1C
HEMOGLOBIN A1C: 5.8 % — AB (ref 4.8–5.6)
Mean Plasma Glucose: 120 mg/dL

## 2016-02-21 LAB — GLUCOSE, CAPILLARY
GLUCOSE-CAPILLARY: 103 mg/dL — AB (ref 65–99)
Glucose-Capillary: 87 mg/dL (ref 65–99)
Glucose-Capillary: 121 mg/dL — ABNORMAL HIGH (ref 65–99)
Glucose-Capillary: 84 mg/dL (ref 65–99)

## 2016-02-21 LAB — CBC
HEMATOCRIT: 37.8 % — AB (ref 39.0–52.0)
HEMOGLOBIN: 12.5 g/dL — AB (ref 13.0–17.0)
MCH: 31.1 pg (ref 26.0–34.0)
MCHC: 33.1 g/dL (ref 30.0–36.0)
MCV: 94 fL (ref 78.0–100.0)
Platelets: 143 10*3/uL — ABNORMAL LOW (ref 150–400)
RBC: 4.02 MIL/uL — AB (ref 4.22–5.81)
RDW: 13.5 % (ref 11.5–15.5)
WBC: 7.7 10*3/uL (ref 4.0–10.5)

## 2016-02-21 MED ORDER — LOPERAMIDE HCL 2 MG PO CAPS: 2.0000 mg | ORAL_CAPSULE | ORAL | Status: DC | PRN

## 2016-02-21 NOTE — Care Management Obs Status (Signed)
MEDICARE OBSERVATION STATUS NOTIFICATION   Patient Details  Name: Mark Mullen MRN: [id number] Date of Birth: Sep 18, 1935   Medicare Observation Status Notification Given:  Yes    Monica Becton, RN 02/21/2016, 12:01 PM

## 2016-02-21 NOTE — Care Management Note (Signed)
Case Management Note  Patient Details  Name: Mark Mullen MRN: [id number] Date of Birth: 26-May-1935  Subjective/Objective:            Admitted with noro virus        Action/Plan: Spoke with patient about discharge plan, he is a resident at [facility] in [place] and plans to return. Referral made to CSW. Contacted [facility] and spoke with RN, they provide HHPT , just needs HHPT and aide order added to FL2. Informed CSW Minerva Areola he will add HHPT and aide order to FL2. Patient states that he has a rolling walker. Will continue to follow for d/c needs.     Expected Discharge Date:                  Expected Discharge Plan:  Assisted Living / Rest Home  In-House Referral:  Clinical Social Work  Discharge planning Services  CM Consult  Post Acute Care Choice:  Home Health Choice offered to:     DME Arranged:    DME Agency:     HH Arranged:  PT, Nurse's Aide HH Agency:  Other - See comment  Status of Service:  In process, will continue to follow  Medicare Important Message Given:    Date Medicare IM Given:    Medicare IM give by:    Date Additional Medicare IM Given:    Additional Medicare Important Message give by:     If discussed at Long Length of Stay Meetings, dates discussed:    Additional Comments:  Monica Becton, RN 02/21/2016, 12:02 PM

## 2016-02-21 NOTE — Progress Notes (Signed)
RN notified from CCMD that pt alternates between 1nd and 2nd degree Heart block, HR was also dropping down into 30-40 range last night and this morning. Pt denies any symptoms. MD notified this morning to see if coreg (scheduled 2x daily) should be given. Received order to d/c coreg but continue lisinopril.   [place], Latricia Heft

## 2016-02-21 NOTE — Progress Notes (Signed)
Was getting frequent calls about patient bradycardia down in the 30's. Looked back at patient history and he was having the same problem the night before. Will pass on to the next shift nurse, will continue to monitor.

## 2016-02-21 NOTE — NC FL2 (Addendum)
[place] MEDICAID FL2 LEVEL OF CARE SCREENING TOOL     IDENTIFICATION  Patient Name: Mark Mullen Birthdate: 1935-04-21 Sex: male Admission Date (Current Location): 02/19/2016  [facility] and [place] Number:  Producer, television/film/video and Address:  [facility] [place]. [facility], [address] [place], [place] [address]      Provider Number: [id number]  Attending Physician Name and Address:  Jerald Kief, MD  Relative Name and Phone Number:  Luisantonio, [place] [phone]    Current Level of Care: Hospital Recommended Level of Care: Assisted Living Facility Prior Approval Number:    Date Approved/Denied:   PASRR Number:    Discharge Plan: Other (Comment) [facility])    Current Diagnoses: Patient Active Problem List   Diagnosis Date Noted  . Vomiting 02/19/2016  . Intractable nausea and vomiting 02/19/2016  . Diarrhea 02/19/2016  . Abnormal EKG 02/19/2016  . Chronic combined systolic and diastolic heart failure (HCC) 02/19/2016  . Mild dementia 01/19/2016  . Essential hypertension 01/19/2016  . Atrial fibrillation (HCC) 01/19/2016  . Cardiomyopathy - etiology not yet determined 10/08/2015  . RBBB 10/08/2015  . Anticoagulated 10/08/2015  . Vesicular rash 10/02/2015  . Hypertensive heart disease with CHF (congestive heart failure) (HCC) 09/26/2015  . Vitamin D deficiency 09/26/2015  . Yeast dermatitis 09/26/2015  . Atrial fibrillation, unspecified   . Alcohol withdrawal (HCC)   . Malnutrition of moderate degree (HCC) 09/14/2015  . Encephalopathy acute 09/13/2015  . Severe sepsis with acute organ dysfunction (HCC) 09/13/2015  . AKI (acute kidney injury) (HCC) 09/13/2015  . PAF (paroxysmal atrial fibrillation) (HCC) 09/13/2015  . NSTEMI (non-ST elevated myocardial infarction) (HCC) 09/13/2015  . Acute combined systolic and diastolic congestive heart failure (HCC) 09/13/2015  . CAP (community acquired pneumonia) 09/13/2015  . Acute respiratory failure  with hypoxia and hypercapnia (HCC) 09/13/2015    Orientation RESPIRATION BLADDER Height & Weight     Self, Situation, Place  Normal Continent Weight: 160 lb 12.8 oz (72.938 kg) Height:     BEHAVIORAL SYMPTOMS/MOOD NEUROLOGICAL BOWEL NUTRITION STATUS      Continent Diet Mechanical Soft and Thin Liquids  AMBULATORY STATUS COMMUNICATION OF NEEDS Skin   Supervision Verbally Normal                       Personal Care Assistance Level of Assistance  Bathing, Dressing Bathing Assistance: Limited assistance   Dressing Assistance: Limited assistance     Functional Limitations Info             SPECIAL CARE FACTORS FREQUENCY  PT (By licensed PT)     PT Frequency: 3 x  a week              Contractures      Additional Factors Info  Insulin Sliding Scale, Allergies, Code Status Code Status Info: Full Code Allergies Info: NKA   Insulin Sliding Scale Info: 3x a day       Current Medications (02/21/2016):  This is the current hospital active medication list Current Facility-Administered Medications  Medication Dose Route Frequency Provider Last Rate Last Dose  . 0.9 %  sodium chloride infusion   Intravenous Continuous Jerald Kief, MD 100 mL/hr at 02/21/16 [phone]    . acetaminophen (TYLENOL) tablet 650 mg  650 mg Oral Q6H PRN Gwenyth Bender, NP       Or  . acetaminophen (TYLENOL) suppository 650 mg  650 mg Rectal Q6H PRN Gwenyth Bender,  NP      . aspirin EC tablet 81 mg  81 mg Oral Daily Gwenyth Bender, NP   81 mg at 02/21/16 0846  . enoxaparin (LOVENOX) injection 40 mg  40 mg Subcutaneous Q24H Gwenyth Bender, NP   40 mg at 02/20/16 2216  . HYDROcodone-acetaminophen (NORCO/VICODIN) 5-325 MG per tablet 1-2 tablet  1-2 tablet Oral Q4H PRN Gwenyth Bender, NP      . insulin aspart (novoLOG) injection 0-5 Units  0-5 Units Subcutaneous QHS Gwenyth Bender, NP   0 Units at 02/19/16 2200  . insulin aspart (novoLOG) injection 0-9 Units  0-9 Units Subcutaneous TID WC Gwenyth Bender, NP    1 Units at 02/19/16 1708  . lisinopril (PRINIVIL,ZESTRIL) tablet 10 mg  10 mg Oral BID Gwenyth Bender, NP   10 mg at 02/21/16 0957  . loperamide (IMODIUM) capsule 2 mg  2 mg Oral PRN Jerald Kief, MD      . morphine 2 MG/ML injection 1 mg  1 mg Intravenous Q2H PRN Gwenyth Bender, NP         Discharge Medications: Please see discharge summary for a list of discharge medications.  Relevant Imaging Results:  Relevant Lab Results:   Additional Information SSN [id number]  PT recommending home health PT and an aide  Aubrie Lucien, Ervin Knack, [address]

## 2016-02-21 NOTE — Clinical Social Work Note (Signed)
Clinical Social Work Assessment  Patient Details  Name: Mark Mullen MRN: [id number] Date of Birth: 02-Mar-1935  Date of referral:  02/21/16               Reason for consult:  Facility Placement                Permission sought to share information with:  Facility Medical sales representative, Family Supports Permission granted to share information::  Yes, Verbal Permission Granted  Name::     Tion, Berhe [phone]  Agency::  [facility]  Relationship::     Contact Information:     Housing/Transportation Living arrangements for the past 2 months:  Assisted Living Facility Source of Information:  Patient Patient Interpreter Needed:  None Criminal Activity/Legal Involvement Pertinent to Current Situation/Hospitalization:  No - Comment as needed Significant Relationships:  Adult Children Lives with:  Facility Resident Do you feel safe going back to the place where you live?  Yes Need for family participation in patient care:  No (Coment)  Care giving concerns: Patient is from [facility] in [place], and does not express any concerns or issues.   Social Worker assessment / plan: Patient is a 81 year old male who is a resident at [facility] in [place].  Patient is alert and oriented x3 and able to make his own decisions.  Patient expressed that he is looking forward to returning back to ALF with home health.  Patient expressed that is is not worried or concerned about returning back to facility.  Patient states he has been at ALF for awhile, but could not specify how long he has been there.  Patient stated he does not have any friends or family close by.  Patient expresses that he has a son, but his son does not live nearby.  Patient stated he does not have any other questions or concerns.  Patient stated he is ready to discharge whenever MD says he can go.  Employment status:  Retired Health and safety inspector:  Medicare PT Recommendations:  Home with Home  Health Information / Referral to community resources:     Patient/Family's Response to care:  Patient in agreement to returning back to ALF.  Patient/Family's Understanding of and Emotional Response to Diagnosis, Current Treatment, and Prognosis:  Patient is aware of the diagnosis and current treatment plan.  Emotional Assessment Appearance:  Appears younger than stated age Attitude/Demeanor/Rapport:    Affect (typically observed):  Appropriate, Mark, Calm, Stable Orientation:  Oriented to Place, Oriented to Self, Oriented to Situation Alcohol / Substance use:  Not Applicable Psych involvement (Current and /or in the community):  No (Comment)  Discharge Needs  Concerns to be addressed:  No discharge needs identified Readmission within the last 30 days:  No Current discharge risk:  None Barriers to Discharge:  No Barriers Identified   Darleene Cleaver, LCSWA 02/21/2016, 5:36 PM

## 2016-02-21 NOTE — Progress Notes (Signed)
[facility] PROGRESS NOTE  Mark Mullen MVV:[id number] DOB: Sep 27, 1935 DOA: 02/19/2016 PCP: Florentina Jenny, MD  HPI/Brief narrative 81 y.o. male with a past medical history that includes hypertension, A. fib, CHF with an EF 30-35% presents to the emergency department with the chief complaint of persistent nausea vomiting and diarrhea  Assessment/Plan: #1. Intractable nausea and vomiting secondary to norovirus enteritis. Peak temp of 100.7 rectally, nontoxic appearing. Exam benign. Mild leukocytosis. No abdominal pain.  -Continued on IVF hydration -Patient reports diarrhea seems to be improving -cont to advance diet as tolerated -Will add imodium for diarrhea  #2. Persistent diarrhea. No recent antibiotics per patient. -C. difficile PCR pos for antigen, neg for toxin suggesting colonization, not active infection -GI pathogen panel confirms norovirus per above -Advancing diet -Gentle IV fluids  #3. A. Fib. chadvasc score 3. Review indicates cardiology opines his only fibrillation was when he was acutely septic. No other dysrhythmia noted. A candidate for anticoagulation EKG with sinus rhythm prolonged PR interval and RRR. Denies chest pain. -stopped beta blocker secondary to bradycardia overnight  #4. Chronic combined systolic and diastolic heart failure. He sent echo with 35% EF grade 2 diastolic dysfunction. Patient appears dehydrated.Saw cardiologist recently. Note indicates recent stress perfusion study demonstrating inferior and inferior apical defect that was decided would be managed medically.  -stopped beta blocker secondary to bradycardia to the 30-40's overnight -Gentle IV fluids -Monitor intake and output -Obtain daily weights  5. Cardiomyopathy. Cardiology note dated February 2017 indicates perfusion study suggests could be ischemic. Patient remains asymptomatic. Presumably related to former EtOH abuse. Medical management -See #4 -Continue lisinopril  #6. Mild  dementia. Recent MRI Moderate ventricular enlargement predominantly involving the lateral and third ventricles. Although there is advanced atrophy, this raises concern for mild hydrocephalus which could be contributing to the patient's dementia. Mild periventricular T2 changes may represent chronic microvascular ischemia first is transependymal CSF flow in the setting of hydrocephalus. Neurology consult 01/19/2016. Please see note. MOCA score 19/30. Ears stable at baseline -continue home meds  #7 Acute renal failure. Renal function improved with IVF  Code Status: Full Family Communication: Pt in room Disposition Plan: Unclear at this time, d/c when able to tolerate PO without significant diarrhea   Consultants:    Procedures:    Antibiotics: Anti-infectives    None      HPI/Subjective: States diarrhea seems improved today  Objective: Filed Vitals:   02/20/16 1432 02/20/16 2120 02/21/16 0538 02/21/16 1409  BP: 118/51 150/69 149/59 150/72  Pulse: 72 61 54 80  Temp: 98.4 F (36.9 C) 98.4 F (36.9 C) 98 F (36.7 C) 98.9 F (37.2 C)  TempSrc: Oral Oral Oral Oral  Resp: 18 18 18 18   Weight:      SpO2: 99% 98% 96% 95%    Intake/Output Summary (Last 24 hours) at 02/21/16 1645 Last data filed at 02/21/16 0900  Gross per 24 hour  Intake    480 ml  Output      0 ml  Net    480 ml   Filed Weights   02/20/16 0500 02/20/16 0643  Weight: 72.576 kg (160 lb) 72.938 kg (160 lb 12.8 oz)    Exam:   General:  Awake, in nad  Cardiovascular: regular, s1, s2  Respiratory: normal resp effort, no wheezing  Abdomen: soft, nondistended  Musculoskeletal: perfused, no clubbing   Data Reviewed: Basic Metabolic Panel:  Recent Labs Lab 02/19/16 0923 02/19/16 0931 02/20/16 0433 02/21/16 0549  NA 141 142 141 141  K 4.1 4.0 3.8 3.8  CL 101 102 106 110  CO2 27  --  26 24  GLUCOSE 155* 149* 120* 105*  BUN 25* 26* 21* 11  CREATININE 1.41* 1.20 1.38* 1.23  CALCIUM 9.5  --   8.3* 7.9*  MG 2.0  --   --   --    Liver Function Tests:  Recent Labs Lab 02/19/16 0923  AST 29  ALT 19  ALKPHOS 43  BILITOT 1.0  PROT 6.5  ALBUMIN 3.8   No results for input(s): LIPASE, AMYLASE in the last 168 hours. No results for input(s): AMMONIA in the last 168 hours. CBC:  Recent Labs Lab 02/19/16 0923 02/19/16 0931 02/20/16 0433 02/21/16 0549  WBC 11.4*  --  7.5 7.7  NEUTROABS 10.4*  --   --   --   HGB 16.9 18.4* 13.5 12.5*  HCT 49.9 54.0* 43.2 37.8*  MCV 92.6  --  93.9 94.0  PLT 194  --  151 143*   Cardiac Enzymes: No results for input(s): CKTOTAL, CKMB, CKMBINDEX, TROPONINI in the last 168 hours. BNP (last 3 results)  Recent Labs  09/13/15 1650  BNP 1302.1*    ProBNP (last 3 results) No results for input(s): PROBNP in the last 8760 hours.  CBG:  Recent Labs Lab 02/20/16 1148 02/20/16 1610 02/20/16 2124 02/21/16 0651 02/21/16 1234  GLUCAP 99 91 108* 87 121*    Recent Results (from the past 240 hour(s))  Urine culture     Status: None   Collection Time: 02/19/16 11:12 AM  Result Value Ref Range Status   Specimen Description URINE, CLEAN CATCH  Final   Special Requests NONE  Final   Culture NO GROWTH 1 DAY  Final   Report Status 02/20/2016 FINAL  Final  C difficile quick scan w PCR reflex     Status: Abnormal   Collection Time: 02/19/16  2:25 PM  Result Value Ref Range Status   C Diff antigen POSITIVE (A) NEGATIVE Final   C Diff toxin NEGATIVE NEGATIVE Final   C Diff interpretation   Final    C. difficile present, but toxin not detected. This indicates colonization. In most cases, this does not require treatment. If patient has signs and symptoms consistent with colitis, consider treatment. Requires ENTERIC precautions.  Gastrointestinal Panel by PCR , Stool     Status: Abnormal   Collection Time: 02/19/16  2:25 PM  Result Value Ref Range Status   Campylobacter species NOT DETECTED NOT DETECTED Final   Plesimonas shigelloides NOT  DETECTED NOT DETECTED Final   Salmonella species NOT DETECTED NOT DETECTED Final   Yersinia enterocolitica NOT DETECTED NOT DETECTED Final   Vibrio species NOT DETECTED NOT DETECTED Final   Vibrio cholerae NOT DETECTED NOT DETECTED Final   Enteroaggregative E coli (EAEC) NOT DETECTED NOT DETECTED Final   Enteropathogenic E coli (EPEC) NOT DETECTED NOT DETECTED Final   Enterotoxigenic E coli (ETEC) NOT DETECTED NOT DETECTED Final   Shiga like toxin producing E coli (STEC) NOT DETECTED NOT DETECTED Final   E. coli O157 NOT DETECTED NOT DETECTED Final   Shigella/Enteroinvasive E coli (EIEC) NOT DETECTED NOT DETECTED Final   Cryptosporidium NOT DETECTED NOT DETECTED Final   Cyclospora cayetanensis NOT DETECTED NOT DETECTED Final   Entamoeba histolytica NOT DETECTED NOT DETECTED Final   Giardia lamblia NOT DETECTED NOT DETECTED Final   Adenovirus F40/41 NOT DETECTED NOT DETECTED Final   Astrovirus NOT DETECTED NOT DETECTED Final   Norovirus GI/GII  DETECTED (A) NOT DETECTED Final    Comment: CRITICAL RESULT CALLED TO, READ BACK BY AND VERIFIED WITH: ANITA PRIDDY,RN  02/20/2016 1748 BY J SUAREZ,MT    Rotavirus A NOT DETECTED NOT DETECTED Final   Sapovirus (I, II, IV, and V) NOT DETECTED NOT DETECTED Final     Studies: No results found.  Scheduled Meds: . aspirin EC  81 mg Oral Daily  . enoxaparin (LOVENOX) injection  40 mg Subcutaneous Q24H  . insulin aspart  0-5 Units Subcutaneous QHS  . insulin aspart  0-9 Units Subcutaneous TID WC  . lisinopril  10 mg Oral BID   Continuous Infusions: . sodium chloride 100 mL/hr at 02/21/16 4098    Principal Problem:   Intractable nausea and vomiting Active Problems:   Atrial fibrillation, unspecified   Cardiomyopathy - etiology not yet determined   Mild dementia   Essential hypertension   Diarrhea   Chronic combined systolic and diastolic heart failure (HCC)  Cayson Kalb K  [facility] Pager [phone]. If 7PM-7AM, please contact  night-coverage at [email or web address], password [facility] 02/21/2016, 4:45 PM

## 2016-02-22 DIAGNOSIS — I429 Cardiomyopathy, unspecified: Secondary | ICD-10-CM

## 2016-02-22 LAB — BASIC METABOLIC PANEL
ANION GAP: 5 (ref 5–15)
BUN: 8 mg/dL (ref 6–20)
CO2: 24 mmol/L (ref 22–32)
Calcium: 8.2 mg/dL — ABNORMAL LOW (ref 8.9–10.3)
Chloride: 112 mmol/L — ABNORMAL HIGH (ref 101–111)
Creatinine, Ser: 1.08 mg/dL (ref 0.61–1.24)
Glucose, Bld: 102 mg/dL — ABNORMAL HIGH (ref 65–99)
POTASSIUM: 3.7 mmol/L (ref 3.5–5.1)
SODIUM: 141 mmol/L (ref 135–145)

## 2016-02-22 LAB — GLUCOSE, CAPILLARY
GLUCOSE-CAPILLARY: 98 mg/dL (ref 65–99)
GLUCOSE-CAPILLARY: 106 mg/dL — AB (ref 65–99)
GLUCOSE-CAPILLARY: 116 mg/dL — AB (ref 65–99)
Glucose-Capillary: 97 mg/dL (ref 65–99)

## 2016-02-22 NOTE — Progress Notes (Signed)
Patient has continued to experience episodes of extreme bradycardia going down to HR 29 being asymptomatic.  Noticed that when patient lay on his left side his HR is dropping.  Had patient reposition and this helped periodically.

## 2016-02-22 NOTE — Progress Notes (Signed)
[facility] PROGRESS NOTE  Mark Mullen ZOX:[id number] DOB: 11-Apr-1935 DOA: 02/19/2016 PCP: Florentina Jenny, MD  HPI/Brief narrative 81 y.o. male with a past medical history that includes hypertension, A. fib, CHF with an EF 30-35% presents to the emergency department with the chief complaint of persistent nausea vomiting and diarrhea  Assessment/Plan: #1. Intractable nausea and vomiting secondary to norovirus enteritis. Peak temp of 100.7 rectally, nontoxic appearing. Exam benign. Mild leukocytosis. No abdominal pain.  -Continued on IVF hydration -Patient reports diarrhea is improving -tolerating soft diet -continue imodium for diarrhea  #2. Persistent diarrhea. No recent antibiotics per patient. -C. difficile PCR pos for antigen, neg for toxin suggesting colonization, not active infection -GI pathogen panel confirms norovirus per above -Advancing diet -Gentle IV fluids  #3. A. Fib. chadvasc score 3. Review indicates cardiology opines his only fibrillation was when he was acutely septic. No other dysrhythmia noted. Denies chest pain. -Sopped beta blocker secondary to bradycardia (see below)  #4. Chronic combined systolic and diastolic heart failure. He sent echo with 35% EF grade 2 diastolic dysfunction. Patient appears dehydrated.Saw cardiologist recently. Note indicates recent stress perfusion study demonstrating inferior and inferior apical defect that was decided would be managed medically.  -stopped beta blocker secondary to bradycardia down to the 20's -Gentle IV fluids -Monitor intake and output -Obtain daily weights  5. Cardiomyopathy. Cardiology note dated February 2017 indicates perfusion study suggests could be ischemic. Patient remains asymptomatic. Presumably related to former EtOH abuse. Medical management -See #4 -Continue lisinopril  #6. Mild dementia. Recent MRI Moderate ventricular enlargement predominantly involving the lateral and third ventricles. Although  there is advanced atrophy, this raises concern for mild hydrocephalus which could be contributing to the patient's dementia. Mild periventricular T2 changes may represent chronic microvascular ischemia first is transependymal CSF flow in the setting of hydrocephalus. Neurology consult 01/19/2016. Please see note. MOCA score 19/30. Ears stable at baseline -continue home meds  #7 Acute renal failure. Renal function improved with IVF  #8 Persistent Bradycardia - Recent HR noted to be persistently in the 30-40's, down to the 20's overnight despite stopping coreg recently - Patient asymptomatic - Patient is followed by Dr. Antoine Poche. Will consult Cardiology for recs  Code Status: Full Family Communication: Pt in room Disposition Plan: Unclear at this time, when cleared by Cardiology   Consultants:  Cardiology  Procedures:    Antibiotics: Anti-infectives    None      HPI/Subjective: Diarrhea continues to improve. Per staff, pt persistently bradycardic overnight  Objective: Filed Vitals:   02/21/16 0538 02/21/16 1409 02/21/16 2101 02/22/16 0619  BP: 149/59 150/72 174/60 174/70  Pulse: 54 80 88   Temp: 98 F (36.7 C) 98.9 F (37.2 C) 98.6 F (37 C) 98.4 F (36.9 C)  TempSrc: Oral Oral Oral Oral  Resp: Weight:      SpO2: 96% 95% 97% 98%    Intake/Output Summary (Last 24 hours) at 02/22/16 1356 Last data filed at 02/22/16 1000  Gross per 24 hour  Intake 1488.33 ml  Output      0 ml  Net 1488.33 ml   Filed Weights   02/20/16 0500 02/20/16 0643  Weight: 72.576 kg (160 lb) 72.938 kg (160 lb 12.8 oz)    Exam:   General:  Awake, in nad, sitting in char  Cardiovascular: regular, s1, s2  Respiratory: normal resp effort, no wheezing  Abdomen: soft, nondistended, pos BS  Musculoskeletal: perfused, no clubbing   Data Reviewed: Basic Metabolic  Panel:  Recent Labs Lab 02/19/16 0923 02/19/16 0931 02/20/16 0433 02/21/16 0549 02/22/16 0548  NA 141  142 141 141 141  K 4.1 4.0 3.8 3.8 3.7  CL 101 102 106 110 112*  CO2 27  --  26 24 24   GLUCOSE 155* 149* 120* 105* 102*  BUN 25* 26* 21* 11 8  CREATININE 1.41* 1.20 1.38* 1.23 1.08  CALCIUM 9.5  --  8.3* 7.9* 8.2*  MG 2.0  --   --   --   --    Liver Function Tests:  Recent Labs Lab 02/19/16 0923  AST 29  ALT 19  ALKPHOS 43  BILITOT 1.0  PROT 6.5  ALBUMIN 3.8   No results for input(s): LIPASE, AMYLASE in the last 168 hours. No results for input(s): AMMONIA in the last 168 hours. CBC:  Recent Labs Lab 02/19/16 0923 02/19/16 0931 02/20/16 0433 02/21/16 0549  WBC 11.4*  --  7.5 7.7  NEUTROABS 10.4*  --   --   --   HGB 16.9 18.4* 13.5 12.5*  HCT 49.9 54.0* 43.2 37.8*  MCV 92.6  --  93.9 94.0  PLT 194  --  151 143*   Cardiac Enzymes: No results for input(s): CKTOTAL, CKMB, CKMBINDEX, TROPONINI in the last 168 hours. BNP (last 3 results)  Recent Labs  09/13/15 1650  BNP 1302.1*    ProBNP (last 3 results) No results for input(s): PROBNP in the last 8760 hours.  CBG:  Recent Labs Lab 02/21/16 1234 02/21/16 1724 02/21/16 2325 02/22/16 0617 02/22/16 1050  GLUCAP 121* 84 103* 98 106*    Recent Results (from the past 240 hour(s))  Urine culture     Status: None   Collection Time: 02/19/16 11:12 AM  Result Value Ref Range Status   Specimen Description URINE, CLEAN CATCH  Final   Special Requests NONE  Final   Culture NO GROWTH 1 DAY  Final   Report Status 02/20/2016 FINAL  Final  C difficile quick scan w PCR reflex     Status: Abnormal   Collection Time: 02/19/16  2:25 PM  Result Value Ref Range Status   C Diff antigen POSITIVE (A) NEGATIVE Final   C Diff toxin NEGATIVE NEGATIVE Final   C Diff interpretation   Final    C. difficile present, but toxin not detected. This indicates colonization. In most cases, this does not require treatment. If patient has signs and symptoms consistent with colitis, consider treatment. Requires ENTERIC precautions.   Gastrointestinal Panel by PCR , Stool     Status: Abnormal   Collection Time: 02/19/16  2:25 PM  Result Value Ref Range Status   Campylobacter species NOT DETECTED NOT DETECTED Final   Plesimonas shigelloides NOT DETECTED NOT DETECTED Final   Salmonella species NOT DETECTED NOT DETECTED Final   Yersinia enterocolitica NOT DETECTED NOT DETECTED Final   Vibrio species NOT DETECTED NOT DETECTED Final   Vibrio cholerae NOT DETECTED NOT DETECTED Final   Enteroaggregative E coli (EAEC) NOT DETECTED NOT DETECTED Final   Enteropathogenic E coli (EPEC) NOT DETECTED NOT DETECTED Final   Enterotoxigenic E coli (ETEC) NOT DETECTED NOT DETECTED Final   Shiga like toxin producing E coli (STEC) NOT DETECTED NOT DETECTED Final   E. coli O157 NOT DETECTED NOT DETECTED Final   Shigella/Enteroinvasive E coli (EIEC) NOT DETECTED NOT DETECTED Final   Cryptosporidium NOT DETECTED NOT DETECTED Final   Cyclospora cayetanensis NOT DETECTED NOT DETECTED Final   Entamoeba histolytica NOT DETECTED  NOT DETECTED Final   Giardia lamblia NOT DETECTED NOT DETECTED Final   Adenovirus F40/41 NOT DETECTED NOT DETECTED Final   Astrovirus NOT DETECTED NOT DETECTED Final   Norovirus GI/GII DETECTED (A) NOT DETECTED Final    Comment: CRITICAL RESULT CALLED TO, READ BACK BY AND VERIFIED WITH: ANITA PRIDDY,RN  02/20/2016 1748 BY J SUAREZ,MT    Rotavirus A NOT DETECTED NOT DETECTED Final   Sapovirus (I, II, IV, and V) NOT DETECTED NOT DETECTED Final     Studies: No results found.  Scheduled Meds: . aspirin EC  81 mg Oral Daily  . enoxaparin (LOVENOX) injection  40 mg Subcutaneous Q24H  . insulin aspart  0-5 Units Subcutaneous QHS  . insulin aspart  0-9 Units Subcutaneous TID WC  . lisinopril  10 mg Oral BID   Continuous Infusions: . sodium chloride 100 mL/hr at 02/21/16 2131    Principal Problem:   Intractable nausea and vomiting Active Problems:   Atrial fibrillation, unspecified   Cardiomyopathy - etiology not  yet determined   Mild dementia   Essential hypertension   Diarrhea   Chronic combined systolic and diastolic heart failure (HCC)  Wilfrido Luedke K  [facility] Pager [phone]. If 7PM-7AM, please contact night-coverage at [email or web address], password [facility] 02/22/2016, 1:56 PM  LOS: 1 day

## 2016-02-22 NOTE — Progress Notes (Signed)
Physical Therapy Treatment Patient Details Name: Mark Mullen MRN: [id number] DOB: [birth year] Today's Date: 02/22/2016    History of Present Illness 81 yo admitted with intractable nausea, vomiting, diarrhea. PMHx: HTN, AFib, CHF    PT Comments    Patient continues to ambulate with shuffling gait and with decreased strength but is progressing toward mobility goals. Pt would benefit from using RW when ambulating due to gait deficits. Patient needs to practice stairs. Continue to progress as tolerated.   Follow Up Recommendations  Home health PT;Supervision for mobility/OOB     Equipment Recommendations  Rolling walker with 5" wheels    Recommendations for Other Services OT consult     Precautions / Restrictions Restrictions Weight Bearing Restrictions: No    Mobility  Bed Mobility Overal bed mobility: Modified Independent             General bed mobility comments: increased time  Transfers Overall transfer level: Needs assistance Equipment used: None Transfers: Sit to/from Stand Sit to Stand: Supervision         General transfer comment: supervision for safety; cues for increased safety awareness  Ambulation/Gait Ambulation/Gait assistance: Min guard Ambulation Distance (Feet): 250 Feet Assistive device: None Gait Pattern/deviations: Decreased stride length;Shuffle;Narrow base of support     General Gait Details: cues for increased bilat step lengths and facilitation of bilat heel strike and for upright posture; pt able to correct gait deficits for very short distances with cues; decreaed arm swing   Stairs            Wheelchair Mobility    Modified Rankin (Stroke Patients Only)       Balance Overall balance assessment: Needs assistance Sitting-balance support: No upper extremity supported;Feet supported Sitting balance-Leahy Scale: Good     Standing balance support: No upper extremity supported;During functional activity Standing  balance-Leahy Scale: Good                      Cognition Arousal/Alertness: Awake/alert Behavior During Therapy: WFL for tasks assessed/performed Overall Cognitive Status: Within Functional Limits for tasks assessed                      Exercises      General Comments General comments (skin integrity, edema, etc.): BP 179/59 and HR 92 after ambulation; pt had no c/o dizziness, lightheadedness, or SOB but reported feeling a little fatigued after ambulating      Pertinent Vitals/Pain      Home Living                      Prior Function            PT Goals (current goals can now be found in the care plan section) Acute Rehab PT Goals Patient Stated Goal: go home Progress towards PT goals: Progressing toward goals    Frequency  Min 3X/week    PT Plan Current plan remains appropriate    Co-evaluation             End of Session Equipment Utilized During Treatment: Gait belt Activity Tolerance: Patient tolerated treatment well Patient left: in bed;with bed alarm set;with call bell/phone within reach     Time: 1410-1435 PT Time Calculation (min) (ACUTE ONLY): 25 min  Charges:  $Gait Training: 8-22 mins $Therapeutic Activity: 8-22 mins                    G Codes:  Derek Mound, PTA Pager: [phone]   02/22/2016, 2:54 PM

## 2016-02-22 NOTE — Consult Note (Signed)
ELECTROPHYSIOLOGY CONSULT NOTE    Patient ID: Mark Mullen MRN: [id number], DOB/AGE: Apr 10, 1935 81 y.o.  Admit date: 02/19/2016 Date of Consult: 02/22/2016  Primary Physician: Florentina Jenny, MD Primary Cardiologist: Dr. Antoine Poche  Reason for Consultation: bradycardia  HPI: Mark Mullen is a 81 y.o. male admitted to [facility] 02/19/16 with intractable N/V/D.  He was being treated with supportive measures, gentle IV hydration found with norovirus. PMHx of fairly new (Sept 2016)  finding of CM and CHF(mixed), during that hospital stay he also had CAP/sepsis and an episode of PAFib.  He has been observed to have bradyardia on telemetry, his coreg stopped 02/20/16 though bradycardia persists.  The patient reports no dizzy spells or near syncope.  He reports a year ago he was found by a neighbor out in the yard "out" he was evaluated at the hospital with unknown findings that he recalls.  He is noted to be a very poor historian.  He states he does not recall any other know events,  Neuro office note reports dizzy spells or floating sensations told to her.  Here he has not been observed to have syncope or near syncope.  The patient denies any symptoms outside of GI.  Past Medical History  Diagnosis Date  . CHF (congestive heart failure) (HCC)   . Hypertension   . Atrial fibrillation (HCC)   . Alcohol abuse      Surgical History:  Past Surgical History  Procedure Laterality Date  . Tonsillectomy       Prescriptions prior to admission  Medication Sig Dispense Refill Last Dose  . Ascorbic Acid (VITAMIN C PO) Take 500 mg by mouth daily.    02/18/2016 at Unknown time  . aspirin EC 81 MG tablet Take 81 mg by mouth daily.   02/18/2016 at Unknown time  . carvedilol (COREG) 6.25 MG tablet Take 7.75 mg by mouth 2 (two) times daily.    02/18/2016 at 2000  . Cholecalciferol (VITAMIN D PO) Take 1,000 Units by mouth daily.    02/18/2016 at Unknown time  . donepezil (ARICEPT) 10 MG tablet Take 1/2 tablet daily for 1  month, then increase to 1 tablet daily and continue (Patient taking differently: Take 10 mg by mouth at bedtime. ) 30 tablet 11 02/18/2016 at Unknown time  . furosemide (LASIX) 20 MG tablet Take 1 tablet (20 mg total) by mouth daily. 30 tablet 6 02/18/2016 at Unknown time  . lisinopril (PRINIVIL,ZESTRIL) 10 MG tablet Take 1 tablet (10 mg total) by mouth 2 (two) times daily. 60 tablet 6 02/18/2016 at Unknown time  . lisinopril (PRINIVIL,ZESTRIL) 2.5 MG tablet Take 1 tablet (2.5 mg total) by mouth 2 (two) times daily. To take in addition to the 10 mg tablet 60 tablet 11 02/18/2016 at Unknown time    Inpatient Medications:  . aspirin EC  81 mg Oral Daily  . enoxaparin (LOVENOX) injection  40 mg Subcutaneous Q24H  . insulin aspart  0-5 Units Subcutaneous QHS  . insulin aspart  0-9 Units Subcutaneous TID WC  . lisinopril  10 mg Oral BID    Allergies: No Known Allergies  Social History   Social History  . Marital Status: Divorced    Spouse Name: N/A  . Number of Children: 2  . Years of Education: N/A   Occupational History  . Retired    Social History Main Topics  . Smoking status: Former Smoker    Types: Cigarettes    Quit date: 09/13/2015  . Smokeless tobacco: Never  Used  . Alcohol Use: No     Comment: former daily user  . Drug Use: No  . Sexual Activity: Not on file   Other Topics Concern  . Not on file   Social History Narrative     Family History  Problem Relation Age of Onset  . Brain cancer Mother   . Aneurysm Father   . Diabetes Sister   . Diabetes Brother      Review of Systems: All other systems reviewed and are otherwise negative except as noted above.  Physical Exam: Filed Vitals:   02/21/16 0538 02/21/16 1409 02/21/16 2101 02/22/16 0619  BP: 149/59 150/72 174/60 174/70  Pulse: 54 80 88   Temp: 98 F (36.7 C) 98.9 F (37.2 C) 98.6 F (37 C) 98.4 F (36.9 C)  TempSrc: Oral Oral Oral Oral  Resp: 18 18 18    Weight:      SpO2: 96% 95% 97% 98%    GEN-  The patient is well appearing, alert and oriented x 3 today.   HEENT: normocephalic, atraumatic; sclera clear, conjunctiva pink; hearing intact; oropharynx clear; neck supple, no JVP Lymph- no cervical lymphadenopathy Lungs- Clear to ausculation bilaterally, normal work of breathing.  No wheezes, rales, rhonchi Heart- Regular rate and rhythm, no murmurs, rubs or gallops, PMI not laterally displaced GI- soft, non-tender, non-distended, bowel sounds present Extremities- no clubbing, cyanosis, or edema; DP/PT/radial pulses 2+ bilaterally MS- no significant deformity or atrophy Skin- warm and dry, no rash or lesion Psych- euthymic mood, full affect Neuro- no gross deficits observed  Labs:   Lab Results  Component Value Date   WBC 7.7 02/21/2016   HGB 12.5* 02/21/2016   HCT 37.8* 02/21/2016   MCV 94.0 02/21/2016   PLT 143* 02/21/2016    Recent Labs Lab 02/19/16 0923  02/22/16 0548  NA 141  < > 141  K 4.1  < > 3.7  CL 101  < > 112*  CO2 27  < > 24  BUN 25*  < > 8  CREATININE 1.41*  < > 1.08  CALCIUM 9.5  < > 8.2*  PROT 6.5  --   --   BILITOT 1.0  --   --   ALKPHOS 43  --   --   ALT 19  --   --   AST 29  --   --   GLUCOSE 155*  < > 102*  < > = values in this interval not displayed.    Radiology/Studies:   Dg Chest Port 1 View 02/19/2016  CLINICAL DATA:  Nausea vomiting and generalized weakness with right-sided arm pain and history of atrial fibrillation and congestive heart failure EXAM: PORTABLE CHEST 1 VIEW COMPARISON:  09/13/2015 FINDINGS: Mild cardiac silhouette enlargement stable. Vascular pattern normal. Elevation of the right diaphragm stable. Lungs clear. Calcified left hilar lymph nodes unchanged. IMPRESSION: No active disease. Electronically Signed   By: Esperanza Heir M.D.   On: 02/19/2016 09:42    EKG:  02/22/16 SB, 1st degree AVBlock, RBBB 02/19/16 appears SR 99bpm, RBBB, ST/T changes appear similar to Sept  TELEMETRY: SB, transiently high 20's beat to beat  particularly overnight, 30's and 40's, APCs/VPCs. Currently SB 50-60's with rates 70'-80s today as well  09/14/15 Echocardiogram Study Conclusions - Left ventricle: The cavity size was normal. Wall thickness was increased with mild hypertrophy of the posterior wall and moderate thickness of the septal wall. Systolic function was moderately to severely reduced. The estimated ejection fraction was in the  range of 30% to 35%. Diffuse hypokinesis. Features are consistent with a pseudonormal left ventricular filling pattern, with concomitant abnormal relaxation and increased filling pressure (grade 2 diastolic dysfunction). Doppler parameters are consistent with high ventricular filling pressure. - Aortic valve: There was moderate regurgitation. - Mitral valve: There was mild regurgitation. - Right ventricle: The cavity size was moderately dilated. Wall thickness was normal. Systolic function was normal. - Pulmonary arteries: PA peak pressure: 36 mm Hg (S). - Pericardium, extracardiac: There was a moderate-sized left pleural effusion. - Impressions: Challenging to assess wall motion and systolic function due to frequent ectopy. Impressions: - The findings indicate significant septal-lateral left ventricular wall dyssynchrony. Challenging to assess wall motion and systolic function due to frequent ectopy.  10/19/16: Stress myoview Large, moderate intensity fixed inferior and inferoapical perfusion defect, suggestive of scar. This is some mild bowel attenuation. Study was not gated due to ectopy. No significant reversible ischemia. This is an intermediate risk study.  Assessment and Plan:  1. Bradycardia     Chronically on Carvedilol for a few months at least, last dose here 02/20/16 PM     On an out patient visit his coreg was decreased from 12.5mg  BID to the current dose, unclear when     HR's have been in the 20's transiently     He remains with ongoing nausea  that is improving without vomiting since yesterday his diarrhea is not completely resolved but much better as well     There may be a component of vagally mediated bradycardia though he has baseline conduction system disease as well and Maejor Erven need BB as a part of his therapy with CM and may require PPM implant, pending further d/w Dr. Elberta Fortis     We Lam Bjorklund get an echo doppler to r e-evaluate his EF         WBC has normalized (from 11.4)  2. Admitted for intractable N/V/D     diarrhea is improved but not resolved     -C. difficile PCR pos for antigen, neg for toxin suggesting colonization, not active infection     -GI pathogen panel confirms norovirus per above  3. DCM     Suspected to be secondary to an acute sepsis,, +/- ETOH he did get a myoview done with a fixed defect only.     Hx of heavy ETOH use though now lives ALF and is abstinent     Compensated by exam and cxr  4. PAFib     A single known event in the environment of septic picture     CHA2DS2Vasc is at least 3, was on Xarelto though stopped with reported gait instability without further arrhythmia noted  5. Mild dementia     Neuro w/u in progress out patient     ?hydrocephalus       Signed, Francis Dowse, PA-C 02/22/2016 12:39 PM   I have seen and examined this patient with Francis Dowse.  Agree with above, note added to reflect my findings.  On exam, regular rhythm, no murmurs, lungs clear.  Had noted bradycardia on the monitor.  He has been sick with N/V.  It is likely that he has been having vagal episodes associated with his bradycardia.  At this time, a pacemaker is not warranted.  Should he have any further bradycardia with symptoms, please call us back for further evaluation.    Clarabelle Oscarson M. Ramell Wacha MD 02/22/2016 4:12 PM

## 2016-02-23 DIAGNOSIS — I5042 Chronic combined systolic (congestive) and diastolic (congestive) heart failure: Secondary | ICD-10-CM

## 2016-02-23 DIAGNOSIS — I4891 Unspecified atrial fibrillation: Secondary | ICD-10-CM

## 2016-02-23 DIAGNOSIS — R001 Bradycardia, unspecified: Secondary | ICD-10-CM

## 2016-02-23 LAB — BASIC METABOLIC PANEL
ANION GAP: 6 (ref 5–15)
BUN: 9 mg/dL (ref 6–20)
CALCIUM: 8.8 mg/dL — AB (ref 8.9–10.3)
CO2: 27 mmol/L (ref 22–32)
Chloride: 109 mmol/L (ref 101–111)
Creatinine, Ser: 1.02 mg/dL (ref 0.61–1.24)
GLUCOSE: 113 mg/dL — AB (ref 65–99)
POTASSIUM: 3.6 mmol/L (ref 3.5–5.1)
SODIUM: 142 mmol/L (ref 135–145)

## 2016-02-23 LAB — GLUCOSE, CAPILLARY
GLUCOSE-CAPILLARY: 103 mg/dL — AB (ref 65–99)
Glucose-Capillary: 109 mg/dL — ABNORMAL HIGH (ref 65–99)
Glucose-Capillary: 83 mg/dL (ref 65–99)

## 2016-02-23 MED ORDER — HYDRALAZINE HCL 10 MG PO TABS
10.0000 mg | ORAL_TABLET | Freq: Three times a day (TID) | ORAL | Status: DC
  Administered 2016-02-23 – 2016-02-25 (×6): 10 mg via ORAL
  Filled 2016-02-23 (×6): qty 1

## 2016-02-23 NOTE — Progress Notes (Signed)
Pt hr improves as pt is awake  Ranging between 55-80 bpm

## 2016-02-23 NOTE — Progress Notes (Addendum)
PROGRESS NOTE  Mark Mullen ONG:[id number] DOB: 18-Oct-1935 DOA: 02/19/2016 PCP: Florentina Jenny, MD  HPI/Recap of past 49 hours: 81 year old with past medical history of hypertension, atrial fibrillation and systolic CHF presented to the emergency room on 3/4 with persistent nausea vomiting and diarrhea.  Felt to be secondary to neurovirus and over the next few days, patient has improved. His also had recurrent episodes of syncope with persistent bradycardia. His Coreg prior to admission had been titrated down and then discontinued altogether as of 3/5. Nevertheless, his heart rate has gotten to as low as 20s while sleeping. Seen by electrophysiology at this time recommending monitoring for now & no pacemaker at this time.  Patient still feels weak, queasy and lightheaded. No vomiting. No room spinning sensation.  Assessment/Plan: Principal Problem:   Intractable nausea and vomiting: Stool cultures positive for normal virus. Peak temperature 100.7. Patient has been improving significantly and tolerating soft diet. On Imodium.   Active Problems:   Atrial fibrillation, unspecified: Chance to ask score of 3. Fibrillation felt to only occur when he was acutely septic. Has stopped the beta blocker secondary to bradycardia.      Chronic combined systolic and diastolic heart failure (HCC)/Cardiomyopathy - etiology not yet determined. Questionable ischemia as per cardiology during previous hospitalization. Possibly related former alcohol abuse. Medically managed.    Mild dementia without acute behavioral disturbance: Recent MRI noting trickier enlargement and some advanced atrophy. Neurology saw patient on previous admission on 2/1. Stable.  Acute kidney injury: Initially secondary dehydration. Improved and back to baseline with IV fluids.   Code Status: Full  Family Communication: left msg with daughter   Disposition Plan: Once heart rate consistently stays above 50 and overall less  symptomatic   Consultants:  Cards-EPS   Procedures:  None   Antibiotics:  None    Objective: BP 175/79 mmHg  Pulse 56  Temp(Src) 99 F (37.2 C) (Oral)  Resp 16  Wt 72.938 kg (160 lb 12.8 oz)  SpO2 98%  Intake/Output Summary (Last 24 hours) at 02/23/16 1325 Last data filed at 02/23/16 0730  Gross per 24 hour  Intake   1080 ml  Output    800 ml  Net    280 ml   Filed Weights   02/20/16 0500 02/20/16 0643  Weight: 72.576 kg (160 lb) 72.938 kg (160 lb 12.8 oz)    Exam:   General:  Alert and oriented 3, no acute distress   Cardiovascular: Bradycardic   Respiratory: Clear to auscultation bilaterally   Abdomen: Soft, Nontender, nondistended few BS   Musculoskeletal: trace edema    Data Reviewed: Basic Metabolic Panel:  Recent Labs Lab 02/19/16 0923 02/19/16 0931 02/20/16 0433 02/21/16 0549 02/22/16 0548 02/23/16 0500  NA 141 142 141 141 141 142  K 4.1 4.0 3.8 3.8 3.7 3.6  CL 101 102 106 110 112* 109  CO2 27  --  26 24 24 27   GLUCOSE 155* 149* 120* 105* 102* 113*  BUN 25* 26* 21* 11 8 9   CREATININE 1.41* 1.20 1.38* 1.23 1.08 1.02  CALCIUM 9.5  --  8.3* 7.9* 8.2* 8.8*  MG 2.0  --   --   --   --   --    Liver Function Tests:  Recent Labs Lab 02/19/16 0923  AST 29  ALT 19  ALKPHOS 43  BILITOT 1.0  PROT 6.5  ALBUMIN 3.8   No results for input(s): LIPASE, AMYLASE in the last 168 hours. No results for input(s):  AMMONIA in the last 168 hours. CBC:  Recent Labs Lab 02/19/16 0923 02/19/16 0931 02/20/16 0433 02/21/16 0549  WBC 11.4*  --  7.5 7.7  NEUTROABS 10.4*  --   --   --   HGB 16.9 18.4* 13.5 12.5*  HCT 49.9 54.0* 43.2 37.8*  MCV 92.6  --  93.9 94.0  PLT 194  --  151 143*   Cardiac Enzymes:   No results for input(s): CKTOTAL, CKMB, CKMBINDEX, TROPONINI in the last 168 hours. BNP (last 3 results)  Recent Labs  09/13/15 1650  BNP 1302.1*    ProBNP (last 3 results) No results for input(s): PROBNP in the last 8760  hours.  CBG:  Recent Labs Lab 02/22/16 0617 02/22/16 1050 02/22/16 1628 02/22/16 2051 02/23/16 0642  GLUCAP 98 106* 97 116* 109*    Recent Results (from the past 240 hour(s))  Urine culture     Status: None   Collection Time: 02/19/16 11:12 AM  Result Value Ref Range Status   Specimen Description URINE, CLEAN CATCH  Final   Special Requests NONE  Final   Culture NO GROWTH 1 DAY  Final   Report Status 02/20/2016 FINAL  Final  C difficile quick scan w PCR reflex     Status: Abnormal   Collection Time: 02/19/16  2:25 PM  Result Value Ref Range Status   C Diff antigen POSITIVE (A) NEGATIVE Final   C Diff toxin NEGATIVE NEGATIVE Final   C Diff interpretation   Final    C. difficile present, but toxin not detected. This indicates colonization. In most cases, this does not require treatment. If patient has signs and symptoms consistent with colitis, consider treatment. Requires ENTERIC precautions.  Gastrointestinal Panel by PCR , Stool     Status: Abnormal   Collection Time: 02/19/16  2:25 PM  Result Value Ref Range Status   Campylobacter species NOT DETECTED NOT DETECTED Final   Plesimonas shigelloides NOT DETECTED NOT DETECTED Final   Salmonella species NOT DETECTED NOT DETECTED Final   Yersinia enterocolitica NOT DETECTED NOT DETECTED Final   Vibrio species NOT DETECTED NOT DETECTED Final   Vibrio cholerae NOT DETECTED NOT DETECTED Final   Enteroaggregative E coli (EAEC) NOT DETECTED NOT DETECTED Final   Enteropathogenic E coli (EPEC) NOT DETECTED NOT DETECTED Final   Enterotoxigenic E coli (ETEC) NOT DETECTED NOT DETECTED Final   Shiga like toxin producing E coli (STEC) NOT DETECTED NOT DETECTED Final   E. coli O157 NOT DETECTED NOT DETECTED Final   Shigella/Enteroinvasive E coli (EIEC) NOT DETECTED NOT DETECTED Final   Cryptosporidium NOT DETECTED NOT DETECTED Final   Cyclospora cayetanensis NOT DETECTED NOT DETECTED Final   Entamoeba histolytica NOT DETECTED NOT DETECTED  Final   Giardia lamblia NOT DETECTED NOT DETECTED Final   Adenovirus F40/41 NOT DETECTED NOT DETECTED Final   Astrovirus NOT DETECTED NOT DETECTED Final   Norovirus GI/GII DETECTED (A) NOT DETECTED Final    Comment: CRITICAL RESULT CALLED TO, READ BACK BY AND VERIFIED WITH: ANITA PRIDDY,RN  02/20/2016 1748 BY J SUAREZ,MT    Rotavirus A NOT DETECTED NOT DETECTED Final   Sapovirus (I, II, IV, and V) NOT DETECTED NOT DETECTED Final     Studies: No results found.  Scheduled Meds: . aspirin EC  81 mg Oral Daily  . enoxaparin (LOVENOX) injection  40 mg Subcutaneous Q24H  . hydrALAZINE  10 mg Oral 3 times per day  . insulin aspart  0-5 Units Subcutaneous QHS  .  insulin aspart  0-9 Units Subcutaneous TID WC  . lisinopril  10 mg Oral BID    Continuous Infusions: . sodium chloride 75 mL/hr at 02/22/16 1715     Time spent: 25 minutes   Hollice Espy  [facility] Pager [phone] . If 7PM-7AM, please contact night-coverage at [email or web address], password [facility] 02/23/2016, 1:25 PM  LOS: 2 days

## 2016-02-23 NOTE — Progress Notes (Signed)
Notified Medical staff on call about patient being extremely bradycardic below 30s in 27-,29 bpm when Patient asleep. Pt being waken up by  nurse and reassess  For dizziness, sob.cough  Pt denied any sympston  . No order given but to Monitor  patient  The above symptoms and call MD..

## 2016-02-24 ENCOUNTER — Encounter (HOSPITAL_COMMUNITY): Payer: Self-pay | Admitting: General Practice

## 2016-02-24 ENCOUNTER — Other Ambulatory Visit: Payer: Self-pay | Admitting: Cardiovascular Disease

## 2016-02-24 DIAGNOSIS — E440 Moderate protein-calorie malnutrition: Secondary | ICD-10-CM

## 2016-02-24 DIAGNOSIS — I5041 Acute combined systolic (congestive) and diastolic (congestive) heart failure: Secondary | ICD-10-CM

## 2016-02-24 DIAGNOSIS — I480 Paroxysmal atrial fibrillation: Secondary | ICD-10-CM

## 2016-02-24 DIAGNOSIS — I10 Essential (primary) hypertension: Secondary | ICD-10-CM

## 2016-02-24 LAB — GLUCOSE, CAPILLARY
GLUCOSE-CAPILLARY: 86 mg/dL (ref 65–99)
Glucose-Capillary: 94 mg/dL (ref 65–99)
Glucose-Capillary: 105 mg/dL — ABNORMAL HIGH (ref 65–99)
Glucose-Capillary: 103 mg/dL — ABNORMAL HIGH (ref 65–99)

## 2016-02-24 LAB — BRAIN NATRIURETIC PEPTIDE: B Natriuretic Peptide: 400.6 pg/mL — ABNORMAL HIGH (ref 0.0–100.0)

## 2016-02-24 MED ORDER — POLYETHYLENE GLYCOL 3350 17 G PO PACK
17.0000 g | PACK | Freq: Once | ORAL | Status: AC
  Administered 2016-02-24: 17 g via ORAL
  Filled 2016-02-24: qty 1

## 2016-02-24 MED ORDER — FUROSEMIDE 10 MG/ML IJ SOLN
20.0000 mg | Freq: Two times a day (BID) | INTRAMUSCULAR | Status: AC
Start: 2016-02-24 — End: 2016-02-25
  Administered 2016-02-24 – 2016-02-25 (×2): 20 mg via INTRAVENOUS
  Filled 2016-02-24 (×2): qty 2

## 2016-02-24 MED ORDER — FUROSEMIDE 10 MG/ML IJ SOLN
20.0000 mg | Freq: Once | INTRAMUSCULAR | Status: AC
  Administered 2016-02-24: 20 mg via INTRAVENOUS
  Filled 2016-02-24: qty 2

## 2016-02-24 NOTE — Clinical Social Work Note (Addendum)
Patient is from [facility], CSW continuing to follow patient's progress.  Mark Mullen. [name], MSW, Theresia Majors [phone] 02/24/2016 6:41 PM

## 2016-02-24 NOTE — Progress Notes (Signed)
PROGRESS NOTE  Mark Mullen ZOX:[id number] DOB: February 19, 1935 DOA: 02/19/2016 PCP: Florentina Jenny, MD  HPI/Recap of past 61 hours: 81 year old with past medical history of hypertension, atrial fibrillation and systolic CHF presented to the emergency room on 3/4 with persistent nausea vomiting and diarrhea.  Felt to be secondary to neurovirus and over the next few days, patient has improved. His also had recurrent episodes of syncope with persistent bradycardia. His Coreg prior to admission had been titrated down and then discontinued altogether as of 3/5. Nevertheless, his heart rate has gotten to as low as 20s while sleeping. Seen by electrophysiology at this time recommending monitoring for now & no pacemaker at this time.  Patient was feeling queasy and lightheaded. By morning of 3/9, patient feeling better. Has not had any heart rate documented less than 50. He himself is feeling much better. He does complain of some constipation. In addition, nursing noted that with exertion, heart rates are tachycardic. Patient denies any shortness of breath  Assessment/Plan: Principal Problem:   Intractable nausea and vomiting: Stool cultures positive for normal virus. Peak temperature 100.7. Patient has been improving significantly and tolerating soft diet. On Imodium.   Active Problems:   Atrial fibrillation, unspecified: Chance to ask score of 3. Fibrillation felt to only occur when he was acutely septic. Has stopped the beta blocker secondary to bradycardia.   Acute on   Chronic combined systolic and diastolic heart failure (HCC)/Cardiomyopathy - etiology not yet determined. Questionable ischemia as per cardiology during previous hospitalization. Possibly related former alcohol abuse. Medically managed.  Given elevated heart rate, 4 pound weight gain since admission and with fluids running at 75 cc an hour since admission, suspect he is not volume overloaded. Has stopped IV fluids and given one dose of Lasix.  Checking BNP    Mild dementia without acute behavioral disturbance: Recent MRI noting trickier enlargement and some advanced atrophy. Neurology saw patient on previous admission on 2/1. Stable.  Acute kidney injury: Initially secondary dehydration. Improved and back to baseline with IV fluids.  Bradycardia: Given sensitivities to beta blocker, have stopped it altogether. It is now out of his system.  Code Status: Full  Family Communication: left msg with son  Disposition Plan: Anticipate discharge once fully diuresed, hopefully tomorrow   Consultants:  Cards-EPS   Procedures:  None   Antibiotics:  None    Objective: BP 186/93 mmHg  Pulse 57  Temp(Src) 98.4 F (36.9 C) (Oral)  Resp 16  Wt 74.481 kg (164 lb 3.2 oz)  SpO2 94%  Intake/Output Summary (Last 24 hours) at 02/24/16 1102 Last data filed at 02/24/16 1041  Gross per 24 hour  Intake    960 ml  Output   1200 ml  Net   -240 ml   Filed Weights   02/20/16 0500 02/20/16 0643 02/24/16 0500  Weight: 72.576 kg (160 lb) 72.938 kg (160 lb 12.8 oz) 74.481 kg (164 lb 3.2 oz)    Exam:   General:  Alert and oriented 3, no acute distress   Cardiovascular: Regular rhythm, borderline bradycardia  Respiratory: Clear to auscultation bilaterally   Abdomen: Soft, Nontender, nondistended few BS   Musculoskeletal: trace edema    Data Reviewed: Basic Metabolic Panel:  Recent Labs Lab 02/19/16 0923 02/19/16 0931 02/20/16 0433 02/21/16 0549 02/22/16 0548 02/23/16 0500  NA 141 142 141 141 141 142  K 4.1 4.0 3.8 3.8 3.7 3.6  CL 101 102 106 110 112* 109  CO2 27  --  26  24 24 27   GLUCOSE 155* 149* 120* 105* 102* 113*  BUN 25* 26* 21* 11 8 9   CREATININE 1.41* 1.20 1.38* 1.23 1.08 1.02  CALCIUM 9.5  --  8.3* 7.9* 8.2* 8.8*  MG 2.0  --   --   --   --   --    Liver Function Tests:  Recent Labs Lab 02/19/16 0923  AST 29  ALT 19  ALKPHOS 43  BILITOT 1.0  PROT 6.5  ALBUMIN 3.8   No results for input(s):  LIPASE, AMYLASE in the last 168 hours. No results for input(s): AMMONIA in the last 168 hours. CBC:  Recent Labs Lab 02/19/16 0923 02/19/16 0931 02/20/16 0433 02/21/16 0549  WBC 11.4*  --  7.5 7.7  NEUTROABS 10.4*  --   --   --   HGB 16.9 18.4* 13.5 12.5*  HCT 49.9 54.0* 43.2 37.8*  MCV 92.6  --  93.9 94.0  PLT 194  --  151 143*   Cardiac Enzymes:   No results for input(s): CKTOTAL, CKMB, CKMBINDEX, TROPONINI in the last 168 hours. BNP (last 3 results)  Recent Labs  09/13/15 1650  BNP 1302.1*    ProBNP (last 3 results) No results for input(s): PROBNP in the last 8760 hours.  CBG:  Recent Labs Lab 02/22/16 2051 02/23/16 0642 02/23/16 1812 02/23/16 2236 02/24/16 0620  GLUCAP 116* 109* 83 103* 94    Recent Results (from the past 240 hour(s))  Urine culture     Status: None   Collection Time: 02/19/16 11:12 AM  Result Value Ref Range Status   Specimen Description URINE, CLEAN CATCH  Final   Special Requests NONE  Final   Culture NO GROWTH 1 DAY  Final   Report Status 02/20/2016 FINAL  Final  C difficile quick scan w PCR reflex     Status: Abnormal   Collection Time: 02/19/16  2:25 PM  Result Value Ref Range Status   C Diff antigen POSITIVE (A) NEGATIVE Final   C Diff toxin NEGATIVE NEGATIVE Final   C Diff interpretation   Final    C. difficile present, but toxin not detected. This indicates colonization. In most cases, this does not require treatment. If patient has signs and symptoms consistent with colitis, consider treatment. Requires ENTERIC precautions.  Gastrointestinal Panel by PCR , Stool     Status: Abnormal   Collection Time: 02/19/16  2:25 PM  Result Value Ref Range Status   Campylobacter species NOT DETECTED NOT DETECTED Final   Plesimonas shigelloides NOT DETECTED NOT DETECTED Final   Salmonella species NOT DETECTED NOT DETECTED Final   Yersinia enterocolitica NOT DETECTED NOT DETECTED Final   Vibrio species NOT DETECTED NOT DETECTED Final    Vibrio cholerae NOT DETECTED NOT DETECTED Final   Enteroaggregative E coli (EAEC) NOT DETECTED NOT DETECTED Final   Enteropathogenic E coli (EPEC) NOT DETECTED NOT DETECTED Final   Enterotoxigenic E coli (ETEC) NOT DETECTED NOT DETECTED Final   Shiga like toxin producing E coli (STEC) NOT DETECTED NOT DETECTED Final   E. coli O157 NOT DETECTED NOT DETECTED Final   Shigella/Enteroinvasive E coli (EIEC) NOT DETECTED NOT DETECTED Final   Cryptosporidium NOT DETECTED NOT DETECTED Final   Cyclospora cayetanensis NOT DETECTED NOT DETECTED Final   Entamoeba histolytica NOT DETECTED NOT DETECTED Final   Giardia lamblia NOT DETECTED NOT DETECTED Final   Adenovirus F40/41 NOT DETECTED NOT DETECTED Final   Astrovirus NOT DETECTED NOT DETECTED Final   Norovirus GI/GII  DETECTED (A) NOT DETECTED Final    Comment: CRITICAL RESULT CALLED TO, READ BACK BY AND VERIFIED WITH: ANITA PRIDDY,RN  02/20/2016 1748 BY J SUAREZ,MT    Rotavirus A NOT DETECTED NOT DETECTED Final   Sapovirus (I, II, IV, and V) NOT DETECTED NOT DETECTED Final     Studies: No results found.  Scheduled Meds: . aspirin EC  81 mg Oral Daily  . enoxaparin (LOVENOX) injection  40 mg Subcutaneous Q24H  . hydrALAZINE  10 mg Oral 3 times per day  . insulin aspart  0-5 Units Subcutaneous QHS  . insulin aspart  0-9 Units Subcutaneous TID WC  . lisinopril  10 mg Oral BID    Continuous Infusions:     Time spent: 25 minutes   Hollice Espy  [facility] Pager [phone] . If 7PM-7AM, please contact night-coverage at [email or web address], password [facility] 02/24/2016, 11:02 AM  LOS: 3 days

## 2016-02-24 NOTE — Progress Notes (Signed)
Initial Nutrition Assessment  DOCUMENTATION CODES:   Non-severe (moderate) malnutrition in context of chronic illness  INTERVENTION:  Encourage adequate PO intake.  Provide nourishment snacks per request.  NUTRITION DIAGNOSIS:   Increased nutrient needs related to chronic illness as evidenced by estimated needs.  GOAL:   Patient will meet greater than or equal to 90% of their needs  MONITOR:   PO intake, Weight trends, Labs, I & O's, Skin  REASON FOR ASSESSMENT:   Consult Assessment of nutrition requirement/status  ASSESSMENT:   81 year old with past medical history of hypertension, atrial fibrillation and systolic CHF presented to the emergency room on 3/4 with persistent nausea vomiting and diarrhea.  Meal completion has been 100%. Pt reports having a good appetite with no other difficulties. Pt reports usually eating well at home with at least 3 meals a day. Weight has been stable with usual body weight of ~164 lbs. Pt was encouraged to adequately eat his food at meals.   Nutrition-Focused physical exam completed. Findings are mild fat depletion, mild to moderate muscle depletion, and no edema.   Labs and medications reviewed.   Diet Order:  DIET SOFT Room service appropriate?: Yes; Fluid consistency:: Thin  Skin:  Reviewed, no issues  Last BM:  3/8  Height:   Ht Readings from Last 1 Encounters:  02/03/16 5\' 6"  (1.676 m)    Weight:   Wt Readings from Last 1 Encounters:  02/24/16 164 lb 3.2 oz (74.481 kg)    Ideal Body Weight:  64.5 kg  BMI:  Body mass index is 26.52 kg/(m^2).  Estimated Nutritional Needs:   Kcal:  1850-2050  Protein:  80-90 grams  Fluid:  1.8 - 2 L/day  EDUCATION NEEDS:   No education needs identified at this time  Roslyn Smiling, MS, RD, LDN Pager # [phone] After hours/ weekend pager # [phone]

## 2016-02-24 NOTE — Progress Notes (Addendum)
Physical Therapy Treatment Patient Details Name: Mark Mullen MRN: [id number] DOB: 02-Aug-1935 Today's Date: 02/24/2016    History of Present Illness 81 yo admitted with intractable nausea, vomiting, diarrhea. PMHx: HTN, AFib, CHF    PT Comments    Patient with improved safety with mobility with use of RW this session. Pt reported using RW when ambulating halls at ALF. HR remained 90-115 during session. Stair training complete. Needs HEP next session. Current plan remains appropriate.   Follow Up Recommendations  Home health PT;Supervision for mobility/OOB     Equipment Recommendations  Rolling walker with 5" wheels    Recommendations for Other Services OT consult     Precautions / Restrictions Restrictions Weight Bearing Restrictions: No    Mobility  Bed Mobility Overal bed mobility: Needs Assistance Bed Mobility: Supine to Sit     Supine to sit: Supervision     General bed mobility comments: cues for technique with HOB flat and no use of bedrails; increased time needed  Transfers Overall transfer level: Needs assistance Equipment used: None Transfers: Sit to/from Stand Sit to Stand: Supervision         General transfer comment: supervision for safety and management of IV  Ambulation/Gait Ambulation/Gait assistance: Supervision Ambulation Distance (Feet): 350 Feet Assistive device: None;Right platform walker Gait Pattern/deviations: Decreased stride length;Shuffle;Trunk flexed;Narrow base of support Gait velocity: increased with use of RW   General Gait Details: pt with tendency to maintain flexed trunk and shuffling gait with no AD inital ~50 ft; pt demonstrated improved bilat step symmetry and length with ability to facilitate bilat heel strike and improve posture with cues and use of RW; educated on sequencing of gait with use of AD and position of RW   Stairs Stairs: Yes Stairs assistance: Supervision Stair Management: One rail Right;Forwards Number of  Stairs: 5 General stair comments: educated on step to pattern for inreased safety and energy conservation  Wheelchair Mobility    Modified Rankin (Stroke Patients Only)       Balance Overall balance assessment: Needs assistance Sitting-balance support: No upper extremity supported Sitting balance-Leahy Scale: Good     Standing balance support: No upper extremity supported Standing balance-Leahy Scale: Good                      Cognition Arousal/Alertness: Awake/alert Behavior During Therapy: WFL for tasks assessed/performed Overall Cognitive Status: Within Functional Limits for tasks assessed                      Exercises      General Comments General comments (skin integrity, edema, etc.): HR remained 90-115 during session      Pertinent Vitals/Pain Pain Assessment: No/denies pain    Home Living                      Prior Function            PT Goals (current goals can now be found in the care plan section) Acute Rehab PT Goals Patient Stated Goal: go home Progress towards PT goals: Progressing toward goals    Frequency  Min 3X/week    PT Plan Current plan remains appropriate    Co-evaluation             End of Session Equipment Utilized During Treatment: Gait belt Activity Tolerance: Patient tolerated treatment well Patient left: with call bell/phone within reach;in chair;with nursing/sitter in room     Time: 1010-1042 PT Time  Calculation (min) (ACUTE ONLY): 32 min  Charges:  $Gait Training: 8-22 mins $Therapeutic Activity: 8-22 mins                    G Codes:      Derek Mound, PTA Pager: [phone]   02/24/2016, 10:57 AM

## 2016-02-25 DIAGNOSIS — F0390 Unspecified dementia without behavioral disturbance: Secondary | ICD-10-CM

## 2016-02-25 LAB — GLUCOSE, CAPILLARY
Glucose-Capillary: 103 mg/dL — ABNORMAL HIGH (ref 65–99)
Glucose-Capillary: 113 mg/dL — ABNORMAL HIGH (ref 65–99)
Glucose-Capillary: 106 mg/dL — ABNORMAL HIGH (ref 65–99)

## 2016-02-25 MED ORDER — CLONIDINE HCL 0.1 MG PO TABS: 0.1000 mg | ORAL_TABLET | Freq: Two times a day (BID) | ORAL | Status: DC

## 2016-02-25 MED ORDER — HYDRALAZINE HCL 10 MG PO TABS: 10.0000 mg | ORAL_TABLET | Freq: Three times a day (TID) | ORAL | Status: AC

## 2016-02-25 NOTE — Progress Notes (Signed)
Physical Therapy Treatment Patient Details Name: Mark Mullen MRN: [id number] DOB: [birth year] Today's Date: 02/25/2016    History of Present Illness 81 yo admitted with intractable nausea, vomiting, diarrhea. PMHx: HTN, AFib, CHF    PT Comments    Pt performed increased gait distance and educated on standing therapeutic exercises to improve strength and promote functional mobility.  Pt reports feeling confident with stair negotiation at home.    Follow Up Recommendations  Home health PT;Supervision for mobility/OOB     Equipment Recommendations  Rolling walker with 5" wheels    Recommendations for Other Services       Precautions / Restrictions Precautions Precautions: Fall Restrictions Weight Bearing Restrictions: No    Mobility  Bed Mobility Overal bed mobility: Modified Independent Bed Mobility: Supine to Sit     Supine to sit: Modified independent (Device/Increase time)     General bed mobility comments: Good technique no cues or assist needed.    Transfers Overall transfer level: Needs assistance Equipment used: Rolling walker (2 wheeled) Transfers: Sit to/from Stand Sit to Stand: Supervision         General transfer comment: Cues for hand placement to push from seated surface.    Ambulation/Gait Ambulation/Gait assistance: Supervision Ambulation Distance (Feet): 600 Feet Assistive device: Rolling walker (2 wheeled) Gait Pattern/deviations: Step-through pattern;Trunk flexed Gait velocity: remains increased.    General Gait Details: Cues to maintain position in RW, pt has tendency to push RW forward and to step R foot outside of Rw during R turns.     Stairs Stairs:  (Pt reports he is confident with stairs after stair training in previous session.  )          Wheelchair Mobility    Modified Rankin (Stroke Patients Only)       Balance     Sitting balance-Leahy Scale: Good       Standing balance-Leahy Scale: Good                       Cognition Arousal/Alertness: Awake/alert Behavior During Therapy: WFL for tasks assessed/performed Overall Cognitive Status: Within Functional Limits for tasks assessed                      Exercises Other Exercises Other Exercises: Pt performed standing therapeutic exercises to improve strength and promote functional independence.  Pt performed heel raises, mini squats, marching, hip abduction/adduction 1x10 reps: HEP issued.      General Comments        Pertinent Vitals/Pain Pain Assessment: No/denies pain    Home Living                      Prior Function            PT Goals (current goals can now be found in the care plan section) Acute Rehab PT Goals Patient Stated Goal: go home Potential to Achieve Goals: Good Progress towards PT goals: Progressing toward goals    Frequency  Min 3X/week    PT Plan Current plan remains appropriate    Co-evaluation             End of Session Equipment Utilized During Treatment: Gait belt Activity Tolerance: Patient tolerated treatment well Patient left: with call bell/phone within reach;in chair;with chair alarm set (MD entered room post tx.  )     Time: 9528-4132 PT Time Calculation (min) (ACUTE ONLY): 24 min  Charges:  $Gait Training: 8-22 mins $Therapeutic  Exercise: 8-22 mins                    G Codes:      Mark Mullen 03/06/16, 12:43 PM  Joycelyn Rua, PTA pager [phone]

## 2016-02-25 NOTE — Discharge Summary (Addendum)
Discharge Summary  Mark Mullen PNT:[id number] DOB: 11-20-1935  PCP: Reymundo Poll, MD  Admit date: 02/19/2016 Discharge date: 02/25/2016  Time spent: 25 minutes   Recommendations for Outpatient Follow-up:  1. Medication change: Coreg discontinued for bradycardia 2. New medication: Hydralazine 10 mg by mouth 3 times a day 3. New medication: Clonidine 0.1 mg by mouth twice a day  4. Patient will follow up with home health PT 5. Patient will follow up with cardiology in the next few weeks  Discharge Diagnoses:  Active Hospital Problems   Diagnosis Date Noted  . Intractable nausea and vomiting 02/19/2016  . Diarrhea 02/19/2016  . Essential hypertension 01/19/2016  . Mild dementia 01/19/2016  . Cardiomyopathy - etiology not yet determined 10/08/2015  . Atrial fibrillation, unspecified   . Malnutrition of moderate degree ([place]) 09/14/2015  . Acute combined systolic and diastolic congestive heart failure ([place]) 09/13/2015    Resolved Hospital Problems   Diagnosis Date Noted Date Resolved  No resolved problems to display.    Discharge Condition: Improved, being discharged back to assisted living   Diet recommendation: Heart healthy   Filed Weights   02/20/16 0643 02/24/16 0500 02/25/16 0500  Weight: 72.938 kg (160 lb 12.8 oz) 74.481 kg (164 lb 3.2 oz) 72.077 kg (158 lb 14.4 oz)    History of present illness:  81 year old with past medical history of hypertension, atrial fibrillation and systolic CHF presented to the emergency room on 3/4 with persistent nausea vomiting and diarrhea.   Hospital Course:  Principal Problem:   Intractable nausea and vomiting with diarrhea/enteritis: Stool cultures positive for normal virus. Patient's peak temperature at 100.7. Over the next few days with hydration and medication for nausea, symptoms resolved. Active Problems:   Acute combined systolic and diastolic congestive heart failure [facility]): Patient with history of cardiomyopathy of unknown  etiology, question ischemia as per cardiology from previous hospitalization even possible alcohol abuse in the past. Medically managed. Patient noted to have elevated heart rate with activity once beta blocker wore off. BNP checked and found to be mildly elevated in the 400s. Fluids discontinued and patient started on IV Lasix. Diuresed several liters and back to her euvolemic.    Malnutrition of moderate degree [facility]): Patient met criteria in the context of chronic illness. Seen by nutrition. Nourishment status given per request.    Atrial fibrillation, unspecified: Chads 2 score of 3. Fibrillation felt only occur when he is acutely septic. Beta blocker stopped secondary to symptomatically bradycardia. The patient having some exertion induced tachycardia, in part from volume overload, better following diuresis. Have added hydralazine and clonidine, the latter of which has some chronotropic effect to limit his heart rate. He will follow up with cardiology as outpatient and at that time beta blocker can be reassessed.  Bradycardia: Patient's GI symptoms improved soon after admission, however he still felt quite lightheaded and queasy. Patient's heart rate was persistently staying in the 40s to 50s while awake and down to as low as the 20s while sleeping. Patient reported some recurrent episodes of syncope in the past. Electrophysiology consulted who recommended monitoring for now, discontinuing his Coreg altogether (which his last visit been on 3/5) and no pacemaker. By 3/9, patient's heart rate baseline in 60s to 70s.    Cardiomyopathy - etiology not yet determined   Mild dementia: Without behavioral disturbance. Recent MRI noting some ventricular enlargement and advanced atrophy. Patient followed by neurology. Stable. Continue on Aricept.  Acute kidney injury: Secondary dehydration, improved and back to baseline  with IV fluids   Procedures:  None   Consultations:  Cardiology-electrophysiology    Discharge Exam: BP 161/95 mmHg  Pulse 81  Temp(Src) 98.6 F (37 C) (Oral)  Resp 16  Ht _0  (1.727 m)  Wt 72.077 kg (158 lb 14.4 oz)  BMI 24.17 kg/m2  SpO2 96%  General: Alert and oriented 3  Cardiovascular: Irregular rhythm, borderline tachycardia Respiratory: Mostly clear, few wheezes   Discharge Instructions You were cared for by a hospitalist during your hospital stay. If you have any questions about your discharge medications or the care you received while you were in the hospital after you are discharged, you can call the unit and asked to speak with the hospitalist on call if the hospitalist that took care of you is not available. Once you are discharged, your primary care physician will handle any further medical issues. Please note that NO REFILLS for any discharge medications will be authorized once you are discharged, as it is imperative that you return to your primary care physician (or establish a relationship with a primary care physician if you do not have one) for your aftercare needs so that they can reassess your need for medications and monitor your lab values.     Medication List    STOP taking these medications        carvedilol 6.25 MG tablet  Commonly known as:  COREG      TAKE these medications        aspirin EC 81 MG tablet  Take 81 mg by mouth daily.     cloNIDine 0.1 MG tablet  Commonly known as:  CATAPRES  Take 1 tablet (0.1 mg total) by mouth 2 (two) times daily.     donepezil 10 MG tablet  Commonly known as:  ARICEPT  Take 1/2 tablet daily for 1 month, then increase to 1 tablet daily and continue     furosemide 20 MG tablet  Commonly known as:  LASIX  Take 1 tablet (20 mg total) by mouth daily.     hydrALAZINE 10 MG tablet  Commonly known as:  APRESOLINE  Take 1 tablet (10 mg total) by mouth every 8 (eight) hours.     lisinopril 10 MG tablet  Commonly known as:  PRINIVIL,ZESTRIL  Take 1 tablet (10 mg total) by mouth 2 (two) times  daily.     lisinopril 2.5 MG tablet  Commonly known as:  PRINIVIL,ZESTRIL  Take 1 tablet (2.5 mg total) by mouth 2 (two) times daily. To take in addition to the 10 mg tablet     VITAMIN C PO  Take 500 mg by mouth daily.     VITAMIN D PO  Take 1,000 Units by mouth daily.       No Known Allergies    The results of significant diagnostics from this hospitalization (including imaging, microbiology, ancillary and laboratory) are listed below for reference.    Significant Diagnostic Studies: Mr Brain Wo Contrast  02/01/2016  CLINICAL DATA:  Mild dementia. EXAM: MRI HEAD WITHOUT CONTRAST TECHNIQUE: Multiplanar, multiecho pulse sequences of the brain and surrounding structures were obtained without intravenous contrast. COMPARISON:  CT head without contrast 09/13/2015. FINDINGS: Moderate moderate ventricular enlargement is present. Advanced atrophy is also noted. This may be proportionate. Early hydrocephalus is also considered. Mild periventricular T2 changes are present without significant white matter disease otherwise. No significant extra-axial fluid collection is present. Moderate white matter changes are present within the brainstem. The cerebellum is unremarkable. Flow is  present in the major intracranial arteries. Globes and orbits are intact. A posterior right ethmoid air cell is opacified. The remaining paranasal sinuses are clear. Mastoid air cells are clear bilaterally. The skullbase is within normal limits. Midline sagittal images are unremarkable. IMPRESSION: 1. Moderate ventricular enlargement predominantly involving the lateral and third ventricles. Although there is advanced atrophy, this raises concern for mild hydrocephalus which could be contributing to the patient's dementia. 2. Mild periventricular T2 changes may represent chronic microvascular ischemia first is transependymal CSF flow in the setting of hydrocephalus. 3. No other acute intracranial abnormality. 4. Minimal sinus  disease. Electronically Signed   By: San Morelle M.D.   On: 02/01/2016 14:27   Dg Chest Port 1 View  02/19/2016  CLINICAL DATA:  Nausea vomiting and generalized weakness with right-sided arm pain and history of atrial fibrillation and congestive heart failure EXAM: PORTABLE CHEST 1 VIEW COMPARISON:  09/13/2015 FINDINGS: Mild cardiac silhouette enlargement stable. Vascular pattern normal. Elevation of the right diaphragm stable. Lungs clear. Calcified left hilar lymph nodes unchanged. IMPRESSION: No active disease. Electronically Signed   By: Skipper Cliche M.D.   On: 02/19/2016 09:42    Microbiology: Recent Results (from the past 240 hour(s))  Urine culture     Status: None   Collection Time: 02/19/16 11:12 AM  Result Value Ref Range Status   Specimen Description URINE, CLEAN CATCH  Final   Special Requests NONE  Final   Culture NO GROWTH 1 DAY  Final   Report Status 02/20/2016 FINAL  Final  C difficile quick scan w PCR reflex     Status: Abnormal   Collection Time: 02/19/16  2:25 PM  Result Value Ref Range Status   C Diff antigen POSITIVE (A) NEGATIVE Final   C Diff toxin NEGATIVE NEGATIVE Final   C Diff interpretation   Final    C. difficile present, but toxin not detected. This indicates colonization. In most cases, this does not require treatment. If patient has signs and symptoms consistent with colitis, consider treatment. Requires ENTERIC precautions.  Gastrointestinal Panel by PCR , Stool     Status: Abnormal   Collection Time: 02/19/16  2:25 PM  Result Value Ref Range Status   Campylobacter species NOT DETECTED NOT DETECTED Final   Plesimonas shigelloides NOT DETECTED NOT DETECTED Final   Salmonella species NOT DETECTED NOT DETECTED Final   Yersinia enterocolitica NOT DETECTED NOT DETECTED Final   Vibrio species NOT DETECTED NOT DETECTED Final   Vibrio cholerae NOT DETECTED NOT DETECTED Final   Enteroaggregative E coli (EAEC) NOT DETECTED NOT DETECTED Final    Enteropathogenic E coli (EPEC) NOT DETECTED NOT DETECTED Final   Enterotoxigenic E coli (ETEC) NOT DETECTED NOT DETECTED Final   Shiga like toxin producing E coli (STEC) NOT DETECTED NOT DETECTED Final   E. coli O157 NOT DETECTED NOT DETECTED Final   Shigella/Enteroinvasive E coli (EIEC) NOT DETECTED NOT DETECTED Final   Cryptosporidium NOT DETECTED NOT DETECTED Final   Cyclospora cayetanensis NOT DETECTED NOT DETECTED Final   Entamoeba histolytica NOT DETECTED NOT DETECTED Final   Giardia lamblia NOT DETECTED NOT DETECTED Final   Adenovirus F40/41 NOT DETECTED NOT DETECTED Final   Astrovirus NOT DETECTED NOT DETECTED Final   Norovirus GI/GII DETECTED (A) NOT DETECTED Final    Comment: CRITICAL RESULT CALLED TO, READ BACK BY AND VERIFIED WITH: ANITA PRIDDY,RN  02/20/2016 1748 BY J SUAREZ,MT    Rotavirus A NOT DETECTED NOT DETECTED Final   Sapovirus (I, II,  IV, and V) NOT DETECTED NOT DETECTED Final     Labs: Basic Metabolic Panel:  Recent Labs Lab 02/19/16 0923 02/19/16 0931 02/20/16 0433 02/21/16 0549 02/22/16 0548 02/23/16 0500  NA 141 142 141 141 141 142  K 4.1 4.0 3.8 3.8 3.7 3.6  CL 101 102 106 110 112* 109  CO2 27  --  _0 GLUCOSE 155* 149* 120* 105* 102* 113*  BUN 25* 26* 21* _1 CREATININE 1.41* 1.20 1.38* 1.23 1.08 1.02  CALCIUM 9.5  --  8.3* 7.9* 8.2* 8.8*  MG 2.0  --   --   --   --   --    Liver Function Tests:  Recent Labs Lab 02/19/16 0923  AST 29  ALT 19  ALKPHOS 43  BILITOT 1.0  PROT 6.5  ALBUMIN 3.8   No results for input(s): LIPASE, AMYLASE in the last 168 hours. No results for input(s): AMMONIA in the last 168 hours. CBC:  Recent Labs Lab 02/19/16 0923 02/19/16 0931 02/20/16 0433 02/21/16 0549  WBC 11.4*  --  7.5 7.7  NEUTROABS 10.4*  --   --   --   HGB 16.9 18.4* 13.5 12.5*  HCT 49.9 54.0* 43.2 37.8*  MCV 92.6  --  93.9 94.0  PLT 194  --  151 143*   Cardiac Enzymes: No results for input(s): CKTOTAL, CKMB, CKMBINDEX,  TROPONINI in the last 168 hours. BNP: BNP (last 3 results)  Recent Labs  09/13/15 1650 02/24/16 1041  BNP 1302.1* 400.6*    ProBNP (last 3 results) No results for input(s): PROBNP in the last 8760 hours.  CBG:  Recent Labs Lab 02/24/16 1231 02/24/16 1558 02/24/16 2030 02/25/16 0624 02/25/16 1126  GLUCAP 86 105* 103* 103* 113*       Signed:  Jhaden Pizzuto K  [facility] 02/25/2016, 1:58 PM

## 2016-02-25 NOTE — Care Management Note (Signed)
Case Management Note  Patient Details  Name: Mark Mullen MRN: [id number] Date of Birth: 1935/06/09  Subjective/Objective:           Admitted with nausea vomiting, noro virus         Action/Plan: Spoke with patient about discharge plan, he is a resident at [facility] in [place] and plans to return. Referral made to CSW. Contacted [facility] and spoke with RN, they provide HHPT , just needs HHPT and aide order added to FL2. Informed CSW Minerva Areola he will add HHPT and aide order to FL2. Patient states that he has a rolling walker. Will continue to follow for d/c needs.    02/25/16 Patient returning to [facility], spoke with Synetta Fail RN at [facility], instructed to fax order for HHPT and aide to [facility] at [phone]. Order faxed. [facility] has internal home health agency that will provide PT. Patient aware that facility will be providing PT.   Expected Discharge Date:                  Expected Discharge Plan:  Assisted Living / Rest Home  In-House Referral:  Clinical Social Work  Discharge planning Services  CM Consult  Post Acute Care Choice:  Home Health Choice offered to:     DME Arranged:    DME Agency:     HH Arranged:  PT, Nurse's Aide HH Agency:  Other - See comment  Status of Service:  Completed, signed off  Medicare Important Message Given:    Date Medicare IM Given:    Medicare IM give by:    Date Additional Medicare IM Given:    Additional Medicare Important Message give by:     If discussed at Long Length of Stay Meetings, dates discussed:    Additional Comments:  Monica Becton, RN 02/25/2016, 2:43 PM

## 2016-02-25 NOTE — Clinical Social Work Note (Signed)
Patient to be d/c'ed today to [facility].  Patient and family agreeable to plans will transport via ems, CSW notified patient's son Lavonne Amparano at [phone]  Windell Moulding, MSW, Theresia Majors [phone]

## 2016-02-29 ENCOUNTER — Ambulatory Visit (INDEPENDENT_AMBULATORY_CARE_PROVIDER_SITE_OTHER): Payer: Medicare Other

## 2016-02-29 DIAGNOSIS — I480 Paroxysmal atrial fibrillation: Secondary | ICD-10-CM

## 2016-03-08 NOTE — Progress Notes (Signed)
Cardiology Office Note   Date:  03/09/2016   ID:  Mark Mullen, DOB 19-Oct-1935, MRN [id number]  PCP:  Reymundo Poll, MD  Cardiologist:   Minus Breeding, MD   No chief complaint on file.     History of Present Illness: Mark Mullen is a 80 y.o. male who presents for evaluation of cardiomyopathy. I met him in the hospital when he was there with pneumonia and sepsis. He had atrial fibrillation with a rapid rate. He did have elevated cardiac enzymes. He was also found to have a cardiomyopathy with an EF of about 25% with diffuse hypokinesis. Follow up stress perfusion study which demonstrated a fixed inferior and inferoapical perfusion defect suggestive of scar. I decided to manage him medically.  He was back in the hospital a couple of days ago with intractable nausea and vomiting. I have reviewed these records. His BNP was slightly elevated. Was treated with IV diuresis. He had symptomatic bradycardia and no further atrial fibrillation and so his beta blocker was stopped.  His digits 5939 with anemia to 31 schedule but we converted him on Synthroid therapy is definitely heart rate did go into the 20s when he was sleeping and he was seen by electrophysiology not to require further study but rather to have discontinuation completely of his carvedilol. He was sent home on event monitor.  He was hypertensive. He had clonidine and hydralazine added.  He returns for follow-up. Since going back to the nursing home he's had no acute complaints. He's been tired. He walks with a walker. He is frail. He denies any cardiovascular symptoms such as presyncope or syncope. There has been no chest discomfort, neck or arm discomfort.  He has no acute SOB, PND or orthopnea.     Past Medical History  Diagnosis Date  . Hypertension   . Atrial fibrillation ([place])   . Alcohol abuse   . GERD (gastroesophageal reflux disease)   . History of hiatal hernia   . Dementia   . CHF (congestive heart failure) [facility])      Past Surgical History  Procedure Laterality Date  . Tonsillectomy    . Eye surgery Right      Current Outpatient Prescriptions  Medication Sig Dispense Refill  . Ascorbic Acid (VITAMIN C PO) Take 500 mg by mouth daily.     Marland Kitchen aspirin EC 81 MG tablet Take 81 mg by mouth daily.    . carvedilol (COREG) 6.25 MG tablet Take 1 tablet by mouth daily. Take 1 tab daily    . Cholecalciferol (VITAMIN D PO) Take 1,000 Units by mouth daily.     . cloNIDine (CATAPRES) 0.1 MG tablet Take 1 tablet (0.1 mg total) by mouth 2 (two) times daily. 60 tablet 11  . donepezil (ARICEPT) 10 MG tablet Take 1/2 tablet daily for 1 month, then increase to 1 tablet daily and continue (Patient taking differently: Take 10 mg by mouth at bedtime. ) 30 tablet 11  . furosemide (LASIX) 20 MG tablet Take 1 tablet (20 mg total) by mouth daily. 30 tablet 6  . hydrALAZINE (APRESOLINE) 10 MG tablet Take 1 tablet (10 mg total) by mouth every 8 (eight) hours. 90 tablet 1  . lisinopril (PRINIVIL,ZESTRIL) 10 MG tablet Take 1 tablet (10 mg total) by mouth 2 (two) times daily. 60 tablet 6  . lisinopril (PRINIVIL,ZESTRIL) 2.5 MG tablet Take 1 tablet (2.5 mg total) by mouth 2 (two) times daily. To take in addition to the 10 mg tablet 60  tablet 11   No current facility-administered medications for this visit.    Allergies:   Review of patient's allergies indicates no known allergies.     ROS:  Please see the history of present illness.   Otherwise, review of systems are positive for none.   All other systems are reviewed and negative.    PHYSICAL EXAM: VS:  BP 132/78 mmHg  Pulse 80  Ht 5' 7"  (1.702 m)  Wt 159 lb 2 oz (72.179 kg)  BMI 24.92 kg/m2 , BMI Body mass index is 24.92 kg/(m^2). GENERAL:  Frail appearing HEENT:  Pupils equal round and reactive, fundi not visualized, oral mucosa unremarkable NECK:  No jugular venous distention, waveform within normal limits, carotid upstroke brisk and symmetric, no bruits, no  thyromegaly LUNGS:  Clear to auscultation bilaterally BACK:  No CVA tenderness CHEST:  Unremarkable HEART:  PMI not displaced or sustained,S1 and S2 within normal limits, no S3, no S4, no clicks, no rubs, no murmurs ABD:  Flat, positive bowel sounds normal in frequency in pitch, no bruits, no rebound, no guarding, no midline pulsatile mass, no hepatomegaly, no splenomegaly EXT:  2 plus pulses throughout, no edema, no cyanosis no clubbing SKIN:  No rashes no nodules    EKG:  EKG is not ordered today.   Recent Labs: 09/13/2015: TSH 0.964 02/19/2016: ALT 19; Magnesium 2.0 02/21/2016: Hemoglobin 12.5*; Platelets 143* 02/23/2016: BUN 9; Creatinine, Ser 1.02; Potassium 3.6; Sodium 142 02/24/2016: B Natriuretic Peptide 400.6*    Lipid Panel No results found for: CHOL, TRIG, HDL, CHOLHDL, VLDL, LDLCALC, LDLDIRECT    Wt Readings from Last 3 Encounters:  03/09/16 159 lb 2 oz (72.179 kg)  02/25/16 158 lb 14.4 oz (72.077 kg)  02/03/16 158 lb 8 oz (71.895 kg)      Other studies Reviewed: Additional studies/ records that were reviewed today include: Hospital records.   Review of the above records demonstrates:  Please see elsewhere in the note.     ASSESSMENT AND PLAN:  Cardiomyopathy -  The perfusion study suggests that this could be ischemic. However, he's not having any symptoms and has a fixed defect. This could also be related to previous alcohol use. I will continue to manage him medically.  He will remain on the meds as listed.   Acute combined systolic and diastolic congestive heart failure Compensated as above  PAF (paroxysmal atrial fibrillation) ([place]) CHADS VASc=3 .   However, his only fibrillation was when he was acutely septic. I see no other dysrhythmia. He is at high risk for bleed with potential for falls. Therefore, he will remain of of anticoagulation.    BRADYCARDIA I will follow up next week to follow up this and his event monitor.  HTN His BP is controlled on his  new combination of meds.    Current medicines are reviewed at length with the patient today.  The patient does not have concerns regarding medicines.  The following changes have been made:  As above  Labs/ tests ordered today include:   No orders of the defined types were placed in this encounter.     Disposition:   FU with me in April   Signed, Minus Breeding, MD  03/09/2016 9:41 AM    [place]

## 2016-03-09 ENCOUNTER — Encounter: Payer: Self-pay | Admitting: Cardiology

## 2016-03-09 ENCOUNTER — Ambulatory Visit (INDEPENDENT_AMBULATORY_CARE_PROVIDER_SITE_OTHER): Payer: Medicare Other | Admitting: Cardiology

## 2016-03-09 VITALS — BP 132/78 | HR 80 | Ht 67.0 in | Wt 159.1 lb

## 2016-03-09 DIAGNOSIS — I429 Cardiomyopathy, unspecified: Secondary | ICD-10-CM

## 2016-03-09 NOTE — Patient Instructions (Signed)
Your physician recommends that you schedule a follow-up appointment in: Keep Appointment on April 28th at 2:30 pm

## 2016-03-11 ENCOUNTER — Telehealth: Payer: Self-pay | Admitting: Physician Assistant

## 2016-03-11 NOTE — Telephone Encounter (Signed)
    I was paged by [employer] about an event monitor recording. The patient had 30 seconds of sinus bradycardia with a heart rate of 30. His heart rate quickly returned to 50 bpm. This was automatically transmitted patient was asymptomatic. I called the patient to confirm this. He is feeling fine. I will forward to Dr. Antoine Poche.   Cline Crock PA-C  [facility]

## 2016-04-14 ENCOUNTER — Ambulatory Visit: Payer: Medicare Other | Admitting: Cardiology

## 2016-05-11 ENCOUNTER — Ambulatory Visit: Payer: Medicare Other | Admitting: Cardiology

## 2016-05-17 NOTE — Progress Notes (Signed)
Cardiology Office Note   Date:  05/18/2016   ID:  Mark Mullen, DOB 1935/04/16, MRN [id number]  PCP:  Reymundo Poll, MD  Cardiologist:   Minus Breeding, MD   No chief complaint on file.     History of Present Illness: Mark Mullen is a 81 y.o. male who presents for evaluation of cardiomyopathy. I met him in the hospital when he was there with pneumonia and sepsis. He had atrial fibrillation with a rapid rate. He did have elevated cardiac enzymes. He was also found to have a cardiomyopathy with an EF of about 25% with diffuse hypokinesis. Follow up stress perfusion study demonstrated a fixed inferior and inferoapical perfusion defect suggestive of scar. I decided to manage him medically.  He was back in the hospital after this with intractable nausea and vomiting. He had symptomatic bradycardia and no further atrial fibrillation and so his beta blocker was stopped.  He was seen by electrophysiology and thought not to require further study but rather to have discontinuation completely of his carvedilol. He was sent home on event monitor.  He was hypertensive. He had clonidine and hydralazine added.  He returns for follow-up.   He lives at a nursing home. There is some element of dementia apparently. He was transported today and doesn't have his usual family member with him. He tends to minimize symptoms but he says he is doing well. He denies any cardiovascular symptoms. In particular does not have presyncope or syncope. He doesn't describe any orthostatic symptoms. He has no shortness of breath, PND or orthopnea. He has no weight gain or edema. He ambulates with a walker and he says he likes to take long walks.       Past Medical History  Diagnosis Date  . Hypertension   . Atrial fibrillation ([place])   . Alcohol abuse   . GERD (gastroesophageal reflux disease)   . History of hiatal hernia   . Dementia   . CHF (congestive heart failure) [facility])     Past Surgical History  Procedure  Laterality Date  . Tonsillectomy    . Eye surgery Right      Current Outpatient Prescriptions  Medication Sig Dispense Refill  . acetaminophen (TYLENOL) 500 MG tablet Take 500 mg by mouth as directed.    . Ascorbic Acid (VITAMIN C PO) Take 500 mg by mouth daily.     Marland Kitchen aspirin 81 MG tablet Take 81 mg by mouth daily.    . carvedilol (COREG) 6.25 MG tablet Take 1 tablet by mouth daily. Take 1 tab daily    . Cholecalciferol (VITAMIN D PO) Take 1,000 Units by mouth daily.     . cloNIDine (CATAPRES) 0.1 MG tablet Take 1 tablet (0.1 mg total) by mouth 2 (two) times daily. 60 tablet 11  . Docosanol (ABREVA) 10 % CREA Apply 1 application topically as directed.    . donepezil (ARICEPT) 10 MG tablet Take 10 mg by mouth at bedtime.    . furosemide (LASIX) 20 MG tablet Take 1 tablet (20 mg total) by mouth daily. 30 tablet 6  . hydrALAZINE (APRESOLINE) 10 MG tablet Take 1 tablet (10 mg total) by mouth every 8 (eight) hours. 90 tablet 1  . lisinopril (PRINIVIL,ZESTRIL) 10 MG tablet Take 1 tablet (10 mg total) by mouth 2 (two) times daily. 60 tablet 6  . lisinopril (PRINIVIL,ZESTRIL) 2.5 MG tablet Take 1 tablet (2.5 mg total) by mouth 2 (two) times daily. To take in addition to the 10  mg tablet 60 tablet 11   No current facility-administered medications for this visit.    Allergies:   Review of patient's allergies indicates no known allergies.     ROS:  Please see the history of present illness.   Otherwise, review of systems are positive for none.   All other systems are reviewed and negative.    PHYSICAL EXAM: VS:  BP 125/74 mmHg  Pulse 63  Ht 5' 8"  (1.727 m)  Wt 161 lb 3.2 oz (73.12 kg)  BMI 24.52 kg/m2 , BMI Body mass index is 24.52 kg/(m^2). GENERAL:  Frail appearing HEENT:  Pupils equal round and reactive, fundi not visualized, oral mucosa unremarkable NECK:  No jugular venous distention, waveform within normal limits, carotid upstroke brisk and symmetric, no bruits, no  thyromegaly LUNGS:  Clear to auscultation bilaterally BACK:  No CVA tenderness CHEST:  Unremarkable HEART:  PMI not displaced or sustained,S1 and S2 within normal limits, no S3, no S4, no clicks, no rubs, no murmurs ABD:  Flat, positive bowel sounds normal in frequency in pitch, no bruits, no rebound, no guarding, no midline pulsatile mass, no hepatomegaly, no splenomegaly EXT:  2 plus pulses throughout, no edema, no cyanosis no clubbing SKIN:  No rashes no nodules    EKG:  EKG is not ordered today.   Recent Labs: 09/13/2015: TSH 0.964 02/19/2016: ALT 19; Magnesium 2.0 02/21/2016: Hemoglobin 12.5*; Platelets 143* 02/23/2016: BUN 9; Creatinine, Ser 1.02; Potassium 3.6; Sodium 142 02/24/2016: B Natriuretic Peptide 400.6*    Lipid Panel No results found for: CHOL, TRIG, HDL, CHOLHDL, VLDL, LDLCALC, LDLDIRECT    Wt Readings from Last 3 Encounters:  05/18/16 161 lb 3.2 oz (73.12 kg)  03/09/16 159 lb 2 oz (72.179 kg)  02/25/16 158 lb 14.4 oz (72.077 kg)      Other studies Reviewed: Additional studies/ records that were reviewed today include: Hospital records.   Review of the above records demonstrates:  Please see elsewhere in the note.     ASSESSMENT AND PLAN:  Cardiomyopathy -  The perfusion study suggests that this could be ischemic. However, he's not having any symptoms and has a fixed defect. This could also be related to previous alcohol use. I will continue to manage him medically.    Acute combined systolic and diastolic congestive heart failure Compensated as above.  I will increase his ACE inhibitor as below.  PAF (paroxysmal atrial fibrillation) ([place]) CHADS VASc=3 .   However, his only fibrillation was when he was acutely septic. I see no other dysrhythmia. He is at high risk for bleed with potential for falls. Therefore, he will remain of of anticoagulation.    BRADYCARDIA I did review his event monitor. He has some bradycardia some of these apparently during the  waking hours following not sure what he is doing at those moments. However, I received no reports of presyncope or syncope. He's not reporting any symptoms related to this. I will be getting red of clonidine which can contribute at times to some bradycardia arrhythmias. Otherwise is no indication for further therapy.  HTN I'm going to increase his lisinopril and get rid of the clonidine. I see him back and make further adjustments.   Current medicines are reviewed at length with the patient today.  The patient does not have concerns regarding medicines.  The following changes have been made:  As above  Labs/ tests ordered today include:   No orders of the defined types were placed in this encounter.  Disposition:   FU with me in 2 months.    Signed, Minus Breeding, MD  05/18/2016 11:01 AM    [place]

## 2016-05-18 ENCOUNTER — Ambulatory Visit (INDEPENDENT_AMBULATORY_CARE_PROVIDER_SITE_OTHER): Payer: Medicare Other | Admitting: Cardiology

## 2016-05-18 ENCOUNTER — Encounter: Payer: Self-pay | Admitting: Cardiology

## 2016-05-18 VITALS — BP 125/74 | HR 63 | Ht 68.0 in | Wt 161.2 lb

## 2016-05-18 DIAGNOSIS — I420 Dilated cardiomyopathy: Secondary | ICD-10-CM

## 2016-05-18 NOTE — Patient Instructions (Signed)
Medication Instructions:  STOP Clonidine and Increase Lisinopril 20 mg twice a day  Labwork: NONE  Testing/Procedures: NONE  Follow-Up: Your physician recommends that you schedule a follow-up appointment in: 2 Months   Any Other Special Instructions Will Be Listed Below (If Applicable).  If you need a refill on your cardiac medications before your next appointment, please call your pharmacy.

## 2016-07-18 NOTE — Progress Notes (Signed)
Cardiology Office Note   Date:  07/20/2016   ID:  Mark Mullen, DOB 01/11/1935, MRN [id number]  PCP:  Florentina Jenny, MD  Cardiologist:   Rollene Rotunda, MD   Chief Complaint  Patient presents with  . Cardiomyopathy      History of Present Illness: Mark Mullen is a 81 y.o. male who presents for evaluation of cardiomyopathy. He was in the hospital earlier this year with pneumonia and sepsis. He had atrial fibrillation with a rapid rate. He did have elevated cardiac enzymes. He was also found to have a cardiomyopathy with an EF of about 25% with diffuse hypokinesis. Follow up stress perfusion study demonstrated a fixed inferior and inferoapical perfusion defect suggestive of scar. I decided to manage him medically.  He was back in the hospital after this with intractable nausea and vomiting. He had symptomatic bradycardia and no further atrial fibrillation and so his beta blocker was stopped.  He was seen by electrophysiology and thought not to require further study but rather to have discontinuation completely of his carvedilol. He was sent home on event monitor.  He was hypertensive. He had clonidine and hydralazine added.   However, at the last appointment with the low heart rate on Holter I stopped the clonidine and increased his ACE inhibitor.   He returns for follow-up.  His blood pressure seemed to be better controlled. He denies any complaints.  The patient denies any new symptoms such as chest discomfort, neck or arm discomfort. There has been no new shortness of breath, PND or orthopnea. There have been no reported palpitations, presyncope or syncope.  He ambulates at his nursing home with a walker .     Past Medical History:  Diagnosis Date  . Alcohol abuse   . Atrial fibrillation ([facility])   . CHF (congestive heart failure) ([facility])   . Dementia   . GERD (gastroesophageal reflux disease)   . History of hiatal hernia   . Hypertension     Past Surgical History:  Procedure  Laterality Date  . EYE SURGERY Right   . TONSILLECTOMY       Current Outpatient Prescriptions  Medication Sig Dispense Refill  . acetaminophen (TYLENOL) 500 MG tablet Take 500 mg by mouth as directed.    Marland Kitchen acyclovir (ZOVIRAX) 400 MG tablet Take 400 mg by mouth 2 (two) times daily.    . Ascorbic Acid (VITAMIN C PO) Take 500 mg by mouth daily.     Marland Kitchen aspirin 81 MG tablet Take 81 mg by mouth daily.    . Cholecalciferol (VITAMIN D PO) Take 1,000 Units by mouth daily.     . Docosanol (ABREVA) 10 % CREA Apply 1 application topically as directed.    . donepezil (ARICEPT) 10 MG tablet Take 10 mg by mouth at bedtime.    . furosemide (LASIX) 20 MG tablet Take 10 mg by mouth. Every Sun, Tue, Thur, Sat    . furosemide (LASIX) 20 MG tablet Take 20 mg by mouth. Every Mon, Wed and Fri    . hydrALAZINE (APRESOLINE) 10 MG tablet Take 1 tablet (10 mg total) by mouth every 8 (eight) hours. 90 tablet 1  . lisinopril (PRINIVIL,ZESTRIL) 20 MG tablet Take 20 mg by mouth 2 (two) times daily.    . Menthol, Topical Analgesic, (BIOFREEZE) 4 % GEL Apply topically. Apply to right shoulder topically every 12 hours as needed for pain.    . polyethylene glycol powder (MIRALAX) powder Take 1 Container by mouth once as  needed.    . polyvinyl alcohol (LIQUIFILM TEARS) 1.4 % ophthalmic solution Place 1 drop into both eyes every 12 (twelve) hours as needed for dry eyes.     No current facility-administered medications for this visit.     Allergies:   Review of patient's allergies indicates no known allergies.    ROS:  Please see the history of present illness.   Otherwise, review of systems are positive for none.   All other systems are reviewed and negative.    PHYSICAL EXAM: VS:  BP 131/70   Pulse (!) 53   Ht  (1.727 m)   Wt 164 lb (74.4 kg)   BMI 24.94 kg/m  , BMI Body mass index is 24.94 kg/m. GENERAL:  Frail appearing HEENT:  Pupils equal round and reactive, fundi not visualized, oral mucosa  unremarkable NECK:  No jugular venous distention, waveform within normal limits, carotid upstroke brisk and symmetric, no bruits, no thyromegaly LUNGS:  Clear to auscultation bilaterally BACK:  No CVA tenderness CHEST:  Unremarkable HEART:  PMI not displaced or sustained,S1 and S2 within normal limits, no S3, no S4, no clicks, no rubs, no murmurs ABD:  Flat, positive bowel sounds normal in frequency in pitch, no bruits, no rebound, no guarding, no midline pulsatile mass, no hepatomegaly, no splenomegaly EXT:  2 plus pulses throughout, no edema, no cyanosis no clubbing SKIN:  No rashes no nodules    EKG:  EKG is not  ordered today.   Recent Labs: 09/13/2015: TSH 0.964 02/19/2016: ALT 19; Magnesium 2.0 02/21/2016: Hemoglobin 12.5; Platelets 143 02/23/2016: BUN 9; Creatinine, Ser 1.02; Potassium 3.6; Sodium 142 02/24/2016: B Natriuretic Peptide 400.6    Lipid Panel No results found for: CHOL, TRIG, HDL, CHOLHDL, VLDL, LDLCALC, LDLDIRECT    Wt Readings from Last 3 Encounters:  07/20/16 164 lb (74.4 kg)  05/18/16 161 lb 3.2 oz (73.1 kg)  03/09/16 159 lb 2 oz (72.2 kg)      Other studies Reviewed: Additional studies/ records that were reviewed today include: None   Review of the above records demonstrates:    ASSESSMENT AND PLAN:  Cardiomyopathy -  The perfusion study suggests that this could be ischemic. However, he's not having any symptoms and has a fixed defect. This could also be related to previous alcohol use. I will continue to manage him medically.  I am going to repeat an echo to see if his ejection fraction is improved now that he has had some med titration and is off alcohol.   Acute combined systolic and diastolic congestive heart failure Compensated as above.  I will consider Entresto in the future.   PAF (paroxysmal atrial fibrillation) ([facility]) CHADS VASc=3 .   However, his only fibrillation was when he was acutely septic. I see no other dysrhythmia. He is at high risk  for bleed with potential for falls. Therefore, he will remain off of anticoagulation.    BRADYCARDIA He has had no symptomatic bradycardia. I changed his meds as above. No change in therapy is indicated.   HTN This is being managed in the context of treating his CHF  Current medicines are reviewed at length with the patient today.  The patient does not have concerns regarding medicines.  The following changes have been made:  None  Labs/ tests ordered today include:   Orders Placed This Encounter  Procedures  . ECHOCARDIOGRAM COMPLETE     Disposition:   FU with me in 4 months.    Signed, Fayrene Fearing  Prayan Ulin, MD  07/20/2016 1:16 PM    [facility]

## 2016-07-20 ENCOUNTER — Ambulatory Visit (INDEPENDENT_AMBULATORY_CARE_PROVIDER_SITE_OTHER): Payer: Medicare Other | Admitting: Cardiology

## 2016-07-20 ENCOUNTER — Encounter: Payer: Self-pay | Admitting: Cardiology

## 2016-07-20 VITALS — BP 131/70 | HR 53 | Ht 68.0 in | Wt 164.0 lb

## 2016-07-20 DIAGNOSIS — I420 Dilated cardiomyopathy: Secondary | ICD-10-CM | POA: Diagnosis not present

## 2016-07-20 NOTE — Patient Instructions (Signed)
Medication Instructions:  Continue current medications  Labwork: NONE  Testing/Procedures: Your physician has requested that you have an echocardiogram. Echocardiography is a painless test that uses sound waves to create images of your heart. It provides your doctor with information about the size and shape of your heart and how well your heart's chambers and valves are working. This procedure takes approximately one hour. There are no restrictions for this procedure.  Follow-Up: Your physician recommends that you schedule a follow-up appointment in: 4 Months   Any Other Special Instructions Will Be Listed Below (If Applicable).   If you need a refill on your cardiac medications before your next appointment, please call your pharmacy.   

## 2016-08-03 ENCOUNTER — Encounter (INDEPENDENT_AMBULATORY_CARE_PROVIDER_SITE_OTHER): Payer: Self-pay

## 2016-08-03 ENCOUNTER — Other Ambulatory Visit: Payer: Self-pay

## 2016-08-03 ENCOUNTER — Ambulatory Visit (HOSPITAL_COMMUNITY): Payer: Medicare Other | Attending: Cardiovascular Disease

## 2016-08-03 DIAGNOSIS — I4891 Unspecified atrial fibrillation: Secondary | ICD-10-CM | POA: Diagnosis not present

## 2016-08-03 DIAGNOSIS — I420 Dilated cardiomyopathy: Secondary | ICD-10-CM | POA: Diagnosis not present

## 2016-08-03 DIAGNOSIS — I119 Hypertensive heart disease without heart failure: Secondary | ICD-10-CM | POA: Diagnosis not present

## 2016-08-03 DIAGNOSIS — I429 Cardiomyopathy, unspecified: Secondary | ICD-10-CM | POA: Diagnosis present

## 2016-08-17 ENCOUNTER — Ambulatory Visit (INDEPENDENT_AMBULATORY_CARE_PROVIDER_SITE_OTHER): Payer: Medicare Other | Admitting: Neurology

## 2016-08-17 ENCOUNTER — Encounter: Payer: Self-pay | Admitting: Neurology

## 2016-08-17 VITALS — BP 120/70 | HR 69 | Ht 68.0 in | Wt 164.4 lb

## 2016-08-17 DIAGNOSIS — F0390 Unspecified dementia without behavioral disturbance: Secondary | ICD-10-CM

## 2016-08-17 DIAGNOSIS — F03A0 Unspecified dementia, mild, without behavioral disturbance, psychotic disturbance, mood disturbance, and anxiety: Secondary | ICD-10-CM

## 2016-08-17 NOTE — Progress Notes (Signed)
Note routed

## 2016-08-17 NOTE — Patient Instructions (Signed)
1. Continue Aricept 10mg  daily 2. Control of blood pressure, cholesterol, as well as physical exercise and brain stimulation exercises are important for brain health 3. Follow-up in 1 year

## 2016-08-17 NOTE — Progress Notes (Signed)
NEUROLOGY FOLLOW UP OFFICE NOTE  Mark HalstedDavid Mullen [id number]  HISTORY OF PRESENT ILLNESS: I had the pleasure of seeing Mark HalstedDavid Oatley in follow-up in the neurology clinic on 08/17/2016.  The patient was last seen 6 months ago for mild dementia. He is alone today. Since his last visit, he feels his memory is "as good as can be expected." He lives at [facility], where meals and medications are provided for him. His son in [place] is in charge of his bills. He does not drive. He reads and watches TV, rarely participates in facility programs. He has an aide who helps him bathe twice a week, he is independent with dressing. 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 He denies any headaches, dizziness, diplopia, focal numbness/tingling/weakness, no falls. He is tolerating Aricept 10mg  daily without side effects.  I personally reviewed MRI brain without contrast done 02/01/16 which showed moderate ventricular enlargement predominantly involving the lateral and third ventricle. Although there is advanced atrophy, mild hydrocephalus is also considered. There is mild chronic microvascular disease. He does not have any other symptoms of NPH (no urinary incontinence or magnetic gait). He ambulates with a walker.  HPI February 2017: This is a very pleasant 81 yo RH man with a history of hypertension, cardiomyopathy, atrial fibrillation, with memory loss. [name] provided majority of the history as he cannot recall some of this. He had been living alone and family had been unaware of memory changes until he was admitted to [facility] in September 2016. Records reviewed, he is somewhat reclusive but family had been unable to reach him by phone for over a week and sent a neighbor to check on him. His neighbor found him tired and sluggish, but he sent his neighbor away. EMS was called to his house, and he was found confused, lethargic, and hypoxic. He was in atrial fibrillation on arrival to the ER, with CHF and pneumonia.  He was discharged to a SNF then is now living in [facility]. While he was admitted to the hospital, his son found out that since January 2016 he had been doing very damaging decision-making which had left him with hardly any finances. He was scanned on the phone and had been giving enormous amounts of money to mail and phone solicitations. His family found out that his bank had called him to stop giving money, but the scammers were persistent and told him to use a different money wiring agency. He continued to wire money until he was admitted to the hospital. He has not been driving since his hospitalization, but denies getting lost driving previously. His wife reports he would get confused when he is a passenger in his car, afraid they would have an accident. He had not been taking any medications prior to his hospital stay, and his ex-wife feels he would not be able to dispense his medications on a daily basis. 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 Sometimes his hands hover over the keyboard before playing. He cannot seem to follow instructions even if they are noted on his calendar, calling family repeatedly and needing constant reassurance and reminders. His wife also reports personality changes, he has some paranoia and has become very rigid in his routine "to the point of neurosis." When his children from out of state come to town, he wants to know the exact hour  and repeatedly asks about it. He is extremely organized but has difficulty operating the phone, such as how to adjust the volume. He is able to dress and bathe independently.  PAST MEDICAL HISTORY: Past Medical History:  Diagnosis Date  . Alcohol abuse   . Atrial fibrillation (HCC)   . CHF (congestive heart failure) (HCC)   . Dementia   . GERD (gastroesophageal reflux disease)   . History of  hiatal hernia   . Hypertension     MEDICATIONS: Current Outpatient Prescriptions on File Prior to Visit  Medication Sig Dispense Refill  . acetaminophen (TYLENOL) 500 MG tablet Take 500 mg by mouth as directed.    Marland Kitchen acyclovir (ZOVIRAX) 400 MG tablet Take 400 mg by mouth 2 (two) times daily.    . Ascorbic Acid (VITAMIN C PO) Take 500 mg by mouth daily.     Marland Kitchen aspirin 81 MG tablet Take 81 mg by mouth daily.    . Cholecalciferol (VITAMIN D PO) Take 1,000 Units by mouth daily.     . Docosanol (ABREVA) 10 % CREA Apply 1 application topically as directed.    . donepezil (ARICEPT) 10 MG tablet Take 10 mg by mouth at bedtime.    . furosemide (LASIX) 20 MG tablet Take 10 mg by mouth. Every Sun, Tue, Thur, Sat    . furosemide (LASIX) 20 MG tablet Take 20 mg by mouth. Every Mon, Wed and Fri    . hydrALAZINE (APRESOLINE) 10 MG tablet Take 1 tablet (10 mg total) by mouth every 8 (eight) hours. 90 tablet 1  . lisinopril (PRINIVIL,ZESTRIL) 20 MG tablet Take 20 mg by mouth 2 (two) times daily.    . Menthol, Topical Analgesic, (BIOFREEZE) 4 % GEL Apply topically. Apply to right shoulder topically every 12 hours as needed for pain.    . polyethylene glycol powder (MIRALAX) powder Take 1 Container by mouth once as needed.    . polyvinyl alcohol (LIQUIFILM TEARS) 1.4 % ophthalmic solution Place 1 drop into both eyes every 12 (twelve) hours as needed for dry eyes.     No current facility-administered medications on file prior to visit.     ALLERGIES: No Known Allergies  FAMILY HISTORY: Family History  Problem Relation Age of Onset  . Brain cancer Mother   . Aneurysm Father   . Diabetes Sister   . Diabetes Brother     SOCIAL HISTORY: Social History   Social History  . Marital status: Divorced    Spouse name: N/A  . Number of children: 2  . Years of education: N/A   Occupational History  . Retired    Social History Main Topics  . Smoking status: Former Smoker    Packs/day: 1.00    Years:  53.00    Types: Cigarettes    Quit date: 09/13/2015  . Smokeless tobacco: Never Used  . Alcohol use 0.0 oz/week     Comment: 02/24/2016 "stopped 3 cocktails/day ~ 08/2015"  . Drug use: No  . Sexual activity: Not Currently   Other Topics Concern  . Not on file   Social History Narrative  . No narrative on file    REVIEW OF SYSTEMS: Constitutional: No fevers, chills, or sweats, no generalized fatigue, change in appetite Eyes: No visual changes, double vision, eye pain Ear, nose and throat: No hearing loss, ear pain, nasal congestion, sore throat Cardiovascular: No chest pain, palpitations Respiratory:  No shortness of breath at rest or with exertion, wheezes GastrointestinaI: No nausea, vomiting, diarrhea, abdominal  pain, fecal incontinence Genitourinary:  No dysuria, urinary retention or frequency Musculoskeletal:  No neck pain, back pain Integumentary: No rash, pruritus, skin lesions Neurological: as above Psychiatric: No depression, insomnia, anxiety Endocrine: No palpitations, fatigue, diaphoresis, mood swings, change in appetite, change in weight, increased thirst Hematologic/Lymphatic:  No anemia, purpura, petechiae. Allergic/Immunologic: no itchy/runny eyes, nasal congestion, recent allergic reactions, rashes  PHYSICAL EXAM: Vitals:   08/17/16 1109  BP: 120/70  Pulse: 69   General: No acute distress Head:  Normocephalic/atraumatic Neck: supple, no paraspinal tenderness, full range of motion Heart:  Regular rate and rhythm Lungs:  Clear to auscultation bilaterally Back: No paraspinal tenderness Skin/Extremities: No rash, no edema Neurological Exam: alert and oriented to person, place, and states it is December 2014. No aphasia or dysarthria. Fund of knowledge is appropriate.  Recent and remote memory are impaired.  Attention and concentration are normal.    Able to name objects and repeat phrases.  Montreal Cognitive Assessment  08/17/2016 01/19/2016  Visuospatial/  Executive (0/5) 4 4  Naming (0/3) 3 3  Attention: Read list of digits (0/2) 2 2  Attention: Read list of letters (0/1) 1 0  Attention: Serial 7 subtraction starting at 100 (0/3) 3 3  Language: Repeat phrase (0/2) 2 2  Language : Fluency (0/1) 0 0  Abstraction (0/2) 2 1  Delayed Recall (0/5) 0 0  Orientation (0/6) 3 4  Total 20 19  Adjusted Score (based on education) - 19    Cranial nerves: Pupils equal, round, reactive to light.  Extraocular movements intact with no nystagmus. Visual fields full. Facial sensation intact. No facial asymmetry. Tongue, uvula, palate midline.  Motor: Bulk and tone normal, muscle strength 5/5 throughout with no pronator drift.  Sensation to light touch intact.  No extinction to double simultaneous stimulation.  Deep tendon reflexes 2+ throughout, toes downgoing.  Finger to nose testing intact.  Gait slow and cautious with walker, no magnetic gait noted. Romberg negative.   IMPRESSION: This is a very pleasant 81 yo RH man with mild dementia. He was was admitted in September 2016 for altered mental status, found to have atrial fibrillation, CHF, and pneumonia. He had not been taking any medications prior to his hospitalization. His family has found out that he had been scammed and giving large amounts of money away since January 2016, unable to take care of his finances. 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 MOCA score today 20/30 (19/30 in February 2017). MRI brain showed ventricular enlargement, as well as advance atrophy. No other signs of NPH aside from dementia. Continue to monitor. Continue Aricept 10mg  daily. He lives in a facility that administers his medications, he does not drive. Continue supportive care. We again discussed the importance of control of vascular risk factors, physical exercise, and brain stimulation exercises for brain health. He will follow-up in 1 year and knows to call for any changes.   Thank you for  allowing me to participate in his care.  Please do not hesitate to call for any questions or concerns.  The duration of this appointment visit was 25 minutes of face-to-face time with the patient.  Greater than 50% of this time was spent in counseling, explanation of diagnosis, planning of further management, and coordination of care.   Patrcia Dolly, M.D.   CC: Dr. Redmond School

## 2016-08-18 ENCOUNTER — Ambulatory Visit: Payer: Medicare Other | Admitting: Neurology

## 2016-11-15 NOTE — Progress Notes (Signed)
Cardiology Office Note   Date:  11/17/2016   ID:  Mark HalstedDavid Mullen, DOB 06/18/1935, MRN [id number]  PCP:  Florentina JennyRIPP, HENRY, MD  Cardiologist:   Rollene RotundaJames Elon Lomeli, MD   Chief Complaint  Patient presents with  . Cardiomyopathy      History of Present Illness: Mark HalstedDavid Mullen is a 81 y.o. male who presents for evaluation of cardiomyopathy. He was in the hospital earlier this year with pneumonia and sepsis. He had atrial fibrillation with a rapid rate. He did have elevated cardiac enzymes. He was also found to have a cardiomyopathy with an EF of about 25% with diffuse hypokinesis. Follow up stress perfusion study demonstrated a fixed inferior and inferoapical perfusion defect suggestive of scar. I decided to manage him medically.  He was back in the hospital after this with intractable nausea and vomiting. He had symptomatic bradycardia and no further atrial fibrillation and so his beta blocker was stopped.  He was seen by electrophysiology and thought not to require further study but rather to have discontinuation completely of his carvedilol. He was sent home on event monitor.  He was hypertensive. He had clonidine and hydralazine added.   However, at a subsequent appointment with the low heart rate on Holter I stopped the clonidine and increased his ACE inhibitor.  He has had a follow up echo and his EF was improved to 50 - 55%.    He returns for follow-up.  Since I last saw him he has done well.  He is working with PT.  He has no acute symptoms.  The patient denies any new symptoms such as chest discomfort, neck or arm discomfort. There has been no new shortness of breath, PND or orthopnea. There have been no reported palpitations, presyncope or syncope.    Past Medical History:  Diagnosis Date  . Alcohol abuse   . Atrial fibrillation (HCC)   . CHF (congestive heart failure) (HCC)   . Dementia   . GERD (gastroesophageal reflux disease)   . History of hiatal hernia   . Hypertension     Past  Surgical History:  Procedure Laterality Date  . EYE SURGERY Right   . TONSILLECTOMY       Current Outpatient Prescriptions  Medication Sig Dispense Refill  . acetaminophen (TYLENOL) 500 MG tablet Take 500 mg by mouth as directed.    Marland Kitchen. acyclovir (ZOVIRAX) 400 MG tablet Take 400 mg by mouth 2 (two) times daily.    . Ascorbic Acid (VITAMIN C PO) Take 500 mg by mouth daily.     Marland Kitchen. aspirin 81 MG tablet Take 81 mg by mouth daily.    . Cholecalciferol (VITAMIN D PO) Take 1,000 Units by mouth daily.     . Docosanol (ABREVA) 10 % CREA Apply 1 application topically as directed.    . donepezil (ARICEPT) 10 MG tablet Take 10 mg by mouth at bedtime.    . furosemide (LASIX) 20 MG tablet Take 10 mg by mouth. Every Sun, Tue, Thur, Sat    . furosemide (LASIX) 20 MG tablet Take 20 mg by mouth. Every Mon, Wed and Fri    . hydrALAZINE (APRESOLINE) 10 MG tablet Take 1 tablet (10 mg total) by mouth every 8 (eight) hours. 90 tablet 1  . lisinopril (PRINIVIL,ZESTRIL) 20 MG tablet Take 20 mg by mouth 2 (two) times daily.    . Menthol, Topical Analgesic, (BIOFREEZE) 4 % GEL Apply topically. Apply to right shoulder topically every 12 hours as needed for pain.    .Marland Kitchen  polyethylene glycol powder (MIRALAX) powder Take 1 Container by mouth once as needed.    . polyvinyl alcohol (LIQUIFILM TEARS) 1.4 % ophthalmic solution Place 1 drop into both eyes every 12 (twelve) hours as needed for dry eyes.     No current facility-administered medications for this visit.     Allergies:   Patient has no known allergies.    ROS:  Please see the history of present illness.   Otherwise, review of systems are positive for none.   All other systems are reviewed and negative.    PHYSICAL EXAM: VS:  BP 136/72   Pulse 77   Ht 5\' 9"  (1.753 m)   Wt 167 lb (75.8 kg)   BMI 24.66 kg/m  , BMI Body mass index is 24.66 kg/m. GENERAL:  Frail appearing HEENT:  Pupils equal round and reactive, fundi not visualized, oral mucosa  unremarkable NECK:  No jugular venous distention, waveform within normal limits, carotid upstroke brisk and symmetric, no bruits, no thyromegaly LUNGS:  Clear to auscultation bilaterally BACK:  No CVA tenderness CHEST:  Unremarkable HEART:  PMI not displaced or sustained,S1 and S2 within normal limits, no S3, no S4, no clicks, no rubs, no murmurs ABD:  Flat, positive bowel sounds normal in frequency in pitch, no bruits, no rebound, no guarding, no midline pulsatile mass, no hepatomegaly, no splenomegaly EXT:  2 plus pulses throughout, no edema, no cyanosis no clubbing SKIN:  No rashes no nodules    EKG:  EKG is ordered today. Sinus rhythm, rate 77, left bundle branch block, premature atrial contractions.   Recent Labs: 02/19/2016: ALT 19; Magnesium 2.0 02/21/2016: Hemoglobin 12.5; Platelets 143 02/23/2016: BUN 9; Creatinine, Ser 1.02; Potassium 3.6; Sodium 142 02/24/2016: B Natriuretic Peptide 400.6    Lipid Panel No results found for: CHOL, TRIG, HDL, CHOLHDL, VLDL, LDLCALC, LDLDIRECT    Wt Readings from Last 3 Encounters:  11/16/16 167 lb (75.8 kg)  08/17/16 164 lb 7 oz (74.6 kg)  07/20/16 164 lb (74.4 kg)      Other studies Reviewed: Additional studies/ records that were reviewed today include: None Review of the above records demonstrates:    ASSESSMENT AND PLAN:  Cardiomyopathy -  This improved with med titration and being off alcohol.  No change in therapy is indicated.    Acute combined systolic and diastolic congestive heart failure As above the EF is improved.  He will remain on meds as listed.    PAF (paroxysmal atrial fibrillation) (HCC) CHADS VASc=3 .   However, his only fibrillation was when he was acutely septic. I see no other dysrhythmia. He is at high risk for bleed with potential for falls. Therefore, he will remain off of anticoagulation.    BRADYCARDIA He has had no symptomatic bradycardia. No change in therapy is indicated.    HTN This is being  managed in the context of treating his CHF  Current medicines are reviewed at length with the patient today.  The patient does not have concerns regarding medicines.  The following changes have been made:  None  Labs/ tests ordered today include: None  Orders Placed This Encounter  Procedures  . EKG 12-Lead     Disposition:   FU with me in 12 months.    Signed, Rollene Rotunda, MD  11/17/2016 8:43 AM    [facility]

## 2016-11-16 ENCOUNTER — Encounter: Payer: Self-pay | Admitting: Cardiology

## 2016-11-16 ENCOUNTER — Ambulatory Visit (INDEPENDENT_AMBULATORY_CARE_PROVIDER_SITE_OTHER): Payer: Medicare Other | Admitting: Cardiology

## 2016-11-16 VITALS — BP 136/72 | HR 77 | Ht 69.0 in | Wt 167.0 lb

## 2016-11-16 DIAGNOSIS — I10 Essential (primary) hypertension: Secondary | ICD-10-CM

## 2016-11-16 DIAGNOSIS — I420 Dilated cardiomyopathy: Secondary | ICD-10-CM

## 2016-11-16 DIAGNOSIS — I480 Paroxysmal atrial fibrillation: Secondary | ICD-10-CM

## 2016-11-16 NOTE — Patient Instructions (Signed)

## 2016-11-17 ENCOUNTER — Encounter: Payer: Self-pay | Admitting: Cardiology

## 2016-12-05 IMAGING — CT CT HEAD W/O CM
2 series · 17 of 30 positions shown, 20 images · non-contrast
Comparison: None.

CLINICAL DATA: Slurred speech.

EXAM:
CT HEAD WITHOUT CONTRAST
TECHNIQUE: Contiguous axial images were obtained from the base of the skull
through the vertex without intravenous contrast.

[Series 2: head w/o · axial · non-contrast · 0.48mm/px · z∈[+1276,+1406]mm · 9 of 34 slices shown, 12 images]
[im 4/34  brain]
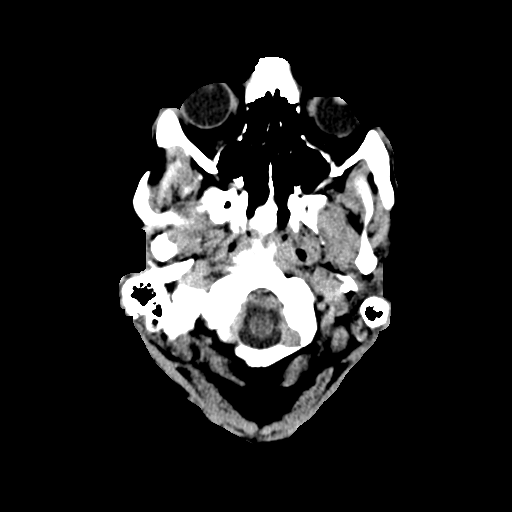
[im 4/34  bone]
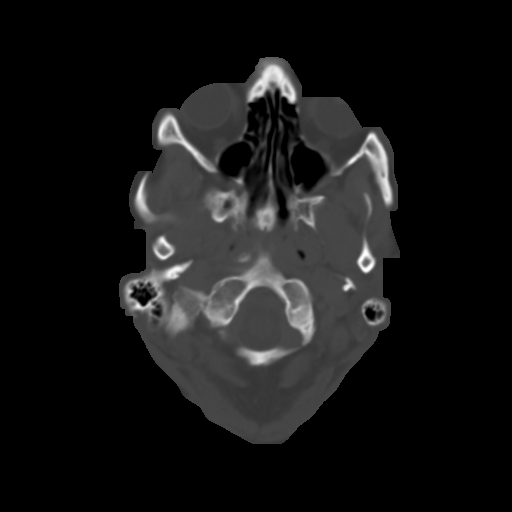
[im 7/34  brain]
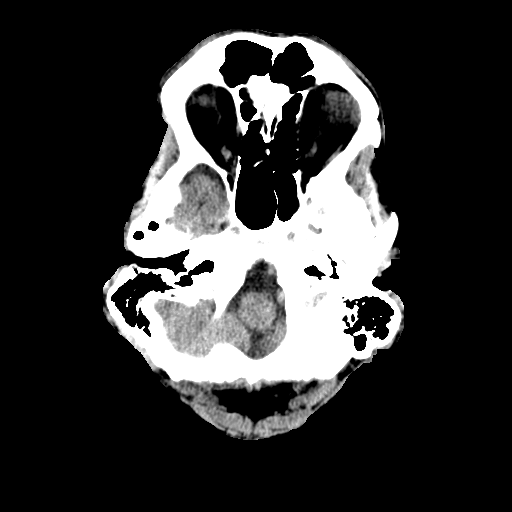
[im 10/34  brain]
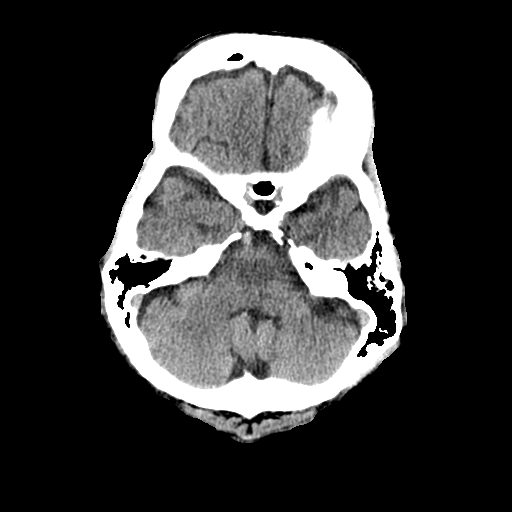
[im 14/34  brain]
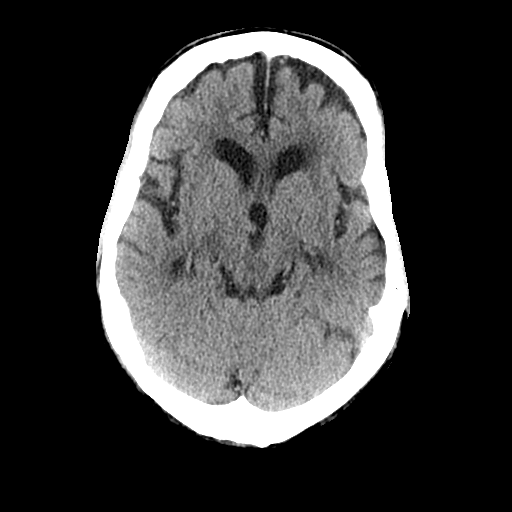
[im 17/34  brain]
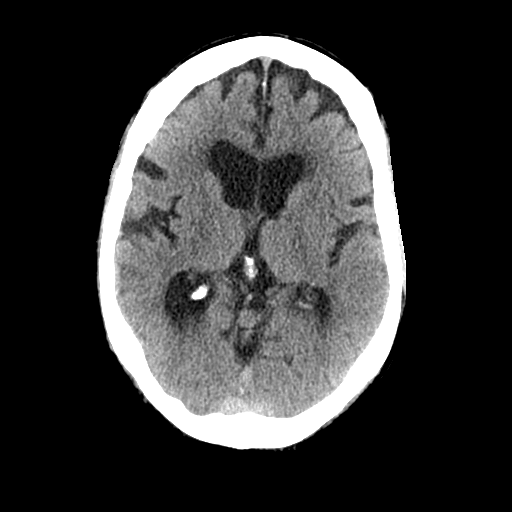
[im 17/34  bone]
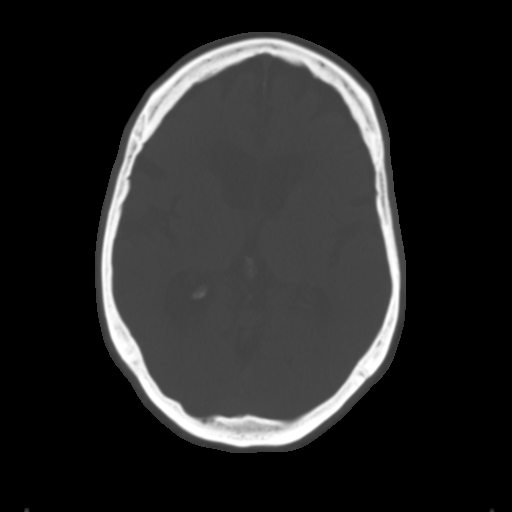
[im 20/34  brain]
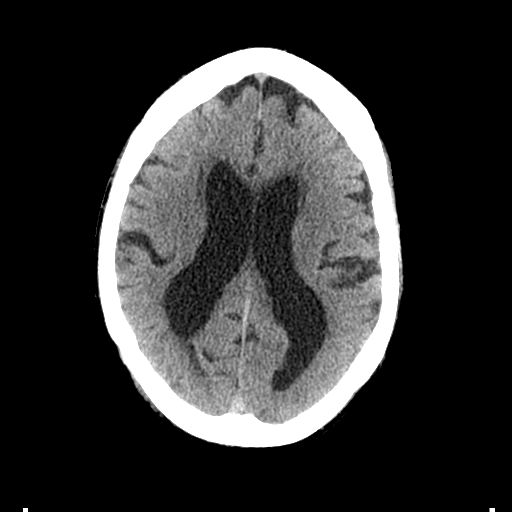
[im 24/34  brain]
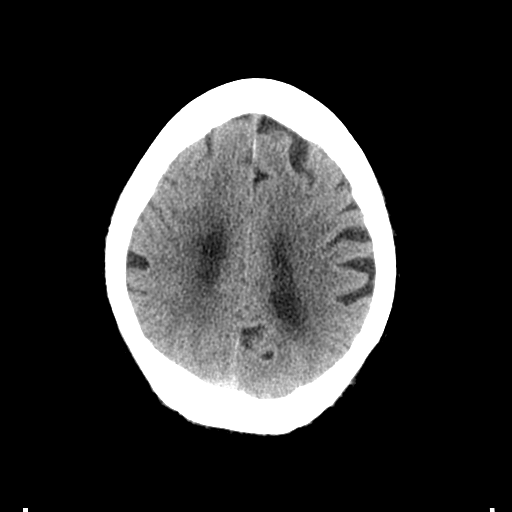
[im 27/34  brain]
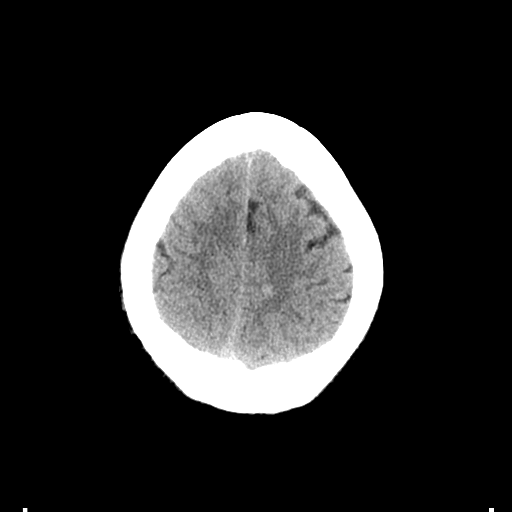
[im 30/34  brain]
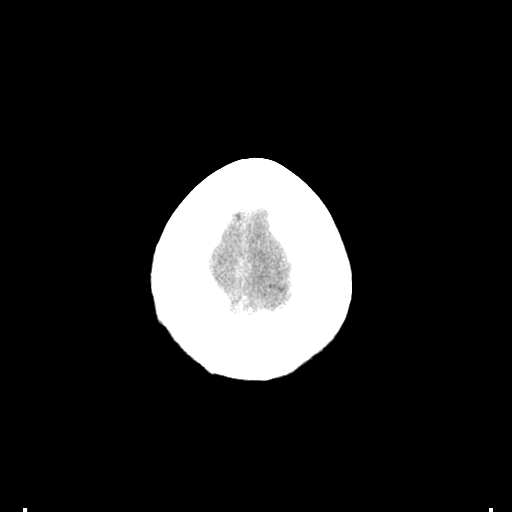
[im 30/34  bone]
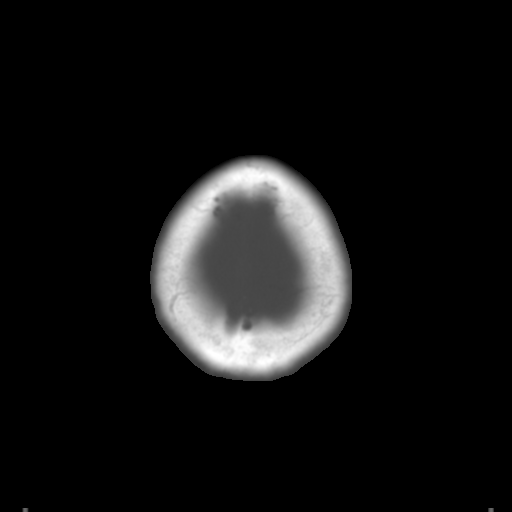

[Series 3: bone windows · axial · 0.48mm/px · z∈[+1279,+1408]mm · 8 of 57 slices shown]
[im 7/57  bone]
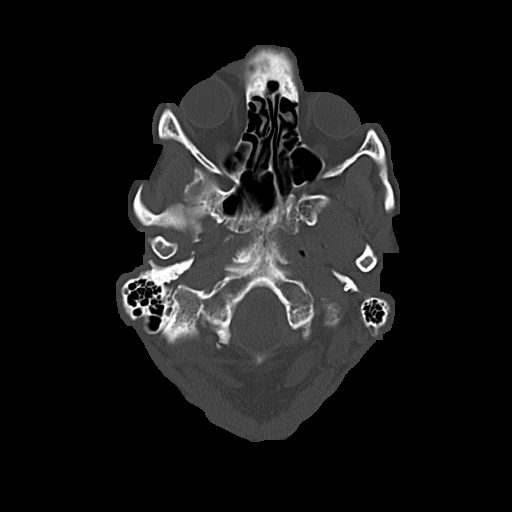
[im 13/57  bone]
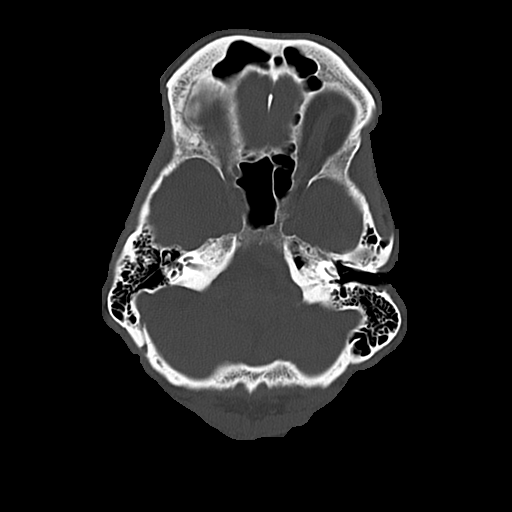
[im 19/57  bone]
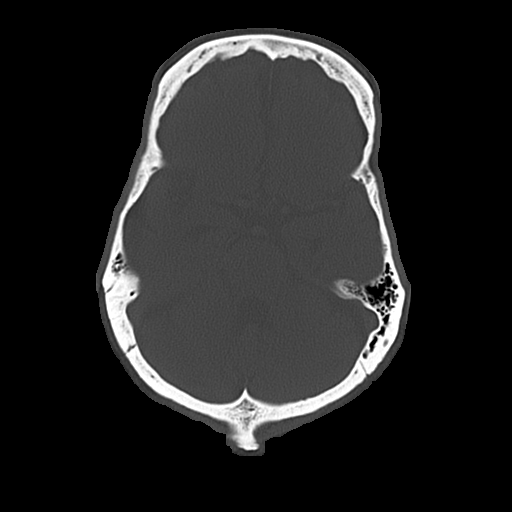
[im 25/57  bone]
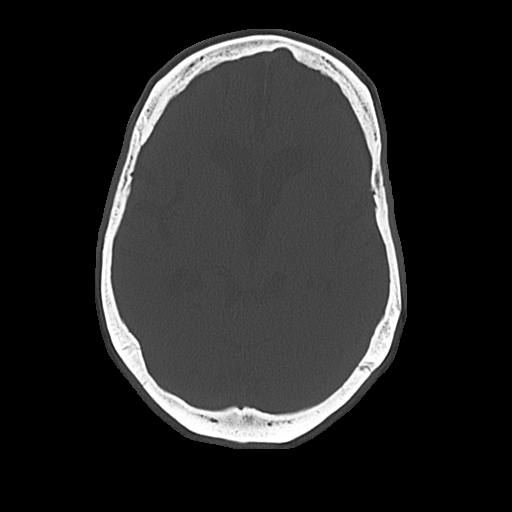
[im 32/57  bone]
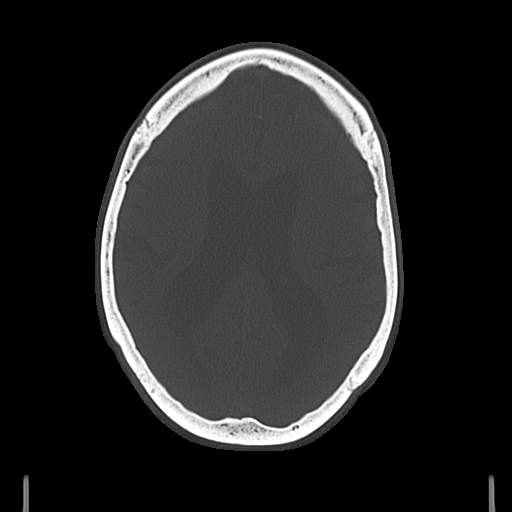
[im 38/57  bone]
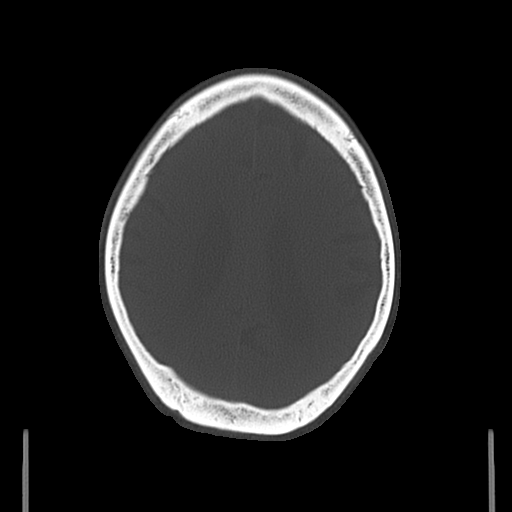
[im 44/57  bone]
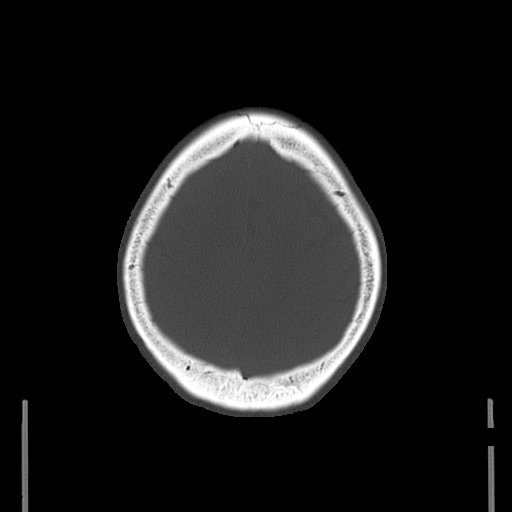
[im 50/57  bone]
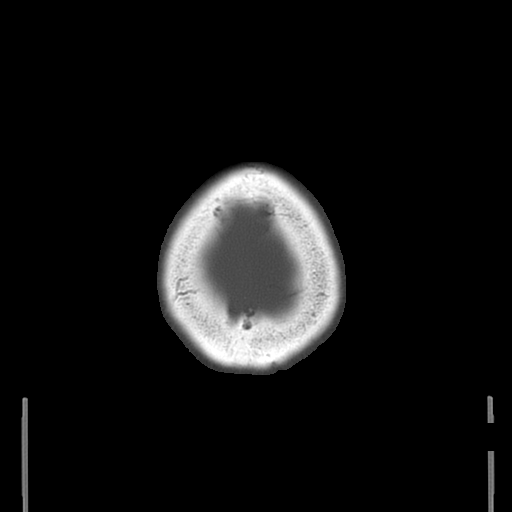

[17 of 30 positions shown; findings below may reference images not displayed]

FINDINGS: The brainstem, cerebellum, cerebral peduncles, thalamus, basal
ganglia, basilar cisterns, and ventricular system appear within
normal limits. Periventricular white matter and corona radiata
hypodensities favor chronic ischemic microvascular white matter
disease. No intracranial hemorrhage, mass lesion, or acute CVA.

Chronic ethmoid and left maxillary sinusitis.
IMPRESSION: 1. No acute intracranial findings.
2. Periventricular white matter and corona radiata hypodensities
favor chronic ischemic microvascular white matter disease.
3. Chronic ethmoid and left maxillary sinusitis.

## 2017-08-21 ENCOUNTER — Ambulatory Visit: Payer: Medicare Other | Admitting: Neurology

## 2017-08-27 ENCOUNTER — Encounter: Payer: Self-pay | Admitting: Neurology

## 2017-11-05 DIAGNOSIS — Z8719 Personal history of other diseases of the digestive system: Secondary | ICD-10-CM | POA: Insufficient documentation

## 2017-11-05 DIAGNOSIS — I509 Heart failure, unspecified: Secondary | ICD-10-CM | POA: Insufficient documentation

## 2017-11-05 DIAGNOSIS — I10 Essential (primary) hypertension: Secondary | ICD-10-CM | POA: Insufficient documentation

## 2017-11-05 DIAGNOSIS — F1010 Alcohol abuse, uncomplicated: Secondary | ICD-10-CM | POA: Insufficient documentation

## 2017-11-05 DIAGNOSIS — F0390 Unspecified dementia without behavioral disturbance: Secondary | ICD-10-CM | POA: Insufficient documentation

## 2017-11-05 DIAGNOSIS — K219 Gastro-esophageal reflux disease without esophagitis: Secondary | ICD-10-CM | POA: Insufficient documentation

## 2017-11-13 ENCOUNTER — Ambulatory Visit: Payer: Medicare Other | Admitting: Neurology

## 2017-11-16 ENCOUNTER — Ambulatory Visit: Payer: Medicare Other | Admitting: Cardiology

## 2017-12-25 ENCOUNTER — Ambulatory Visit (INDEPENDENT_AMBULATORY_CARE_PROVIDER_SITE_OTHER): Payer: Medicare Other | Admitting: Neurology

## 2017-12-25 VITALS — BP 108/66 | HR 126 | Ht 69.0 in | Wt 166.0 lb

## 2017-12-25 DIAGNOSIS — F0390 Unspecified dementia without behavioral disturbance: Secondary | ICD-10-CM

## 2017-12-25 DIAGNOSIS — F03A0 Unspecified dementia, mild, without behavioral disturbance, psychotic disturbance, mood disturbance, and anxiety: Secondary | ICD-10-CM

## 2017-12-25 NOTE — Patient Instructions (Signed)
Great to see you! Continue Donepezil 10mg  daily. Follow-up in 1 year, call for any changes.   RECOMMENDATIONS FOR ALL PATIENTS WITH MEMORY PROBLEMS: 1. Continue to exercise (Recommend 30 minutes of walking everyday, or 3 hours every week) 2. Increase social interactions - continue going to [employer] and enjoy social gatherings with friends and family 3. Eat healthy, avoid fried foods and eat more fruits and vegetables 4. Maintain adequate blood pressure, blood sugar, and blood cholesterol level. Reducing the risk of stroke and cardiovascular disease also helps promoting better memory. 5. Avoid stressful situations. Live a simple life and avoid aggravations. Organize your time and prepare for the next day in anticipation. 6. Sleep well, avoid any interruptions of sleep and avoid any distractions in the bedroom that may interfere with adequate sleep quality 7. Avoid sugar, avoid sweets as there is a strong link between excessive sugar intake, diabetes, and cognitive impairment The Mediterranean diet has been shown to help patients reduce the risk of progressive memory disorders and reduces cardiovascular risk. This includes eating fish, eat fruits and green leafy vegetables, nuts like almonds and hazelnuts, walnuts, and also use olive oil. Avoid fast foods and fried foods as much as possible. Avoid sweets and sugar as sugar use has been linked to worsening of memory function.

## 2017-12-25 NOTE — Progress Notes (Signed)
NEUROLOGY FOLLOW UP OFFICE NOTE  Mark Mullen [id number]  DOB: 01/09/1935  HISTORY OF PRESENT ILLNESS: I had the pleasure of seeing Mark Mullen in follow-up in the neurology clinic on 81/07/2018.  The patient was last seen more than a year ago for mild dementia. He is again alone in the office today. Since his last visit, he feels his memory is pretty good. He lives at [facility], where meals and medications are provided for him. His son in [place] is in charge of his bills. He does not drive. He is independent with dressing and has staff who assists him with showering once a week. He occasionally joins SNF activities. 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 He denies any headaches, dizziness, diplopia, focal numbness/tingling/weakness, no falls. He is tolerating Aricept 10mg  daily without side effects.  HPI February 2017: This is a very pleasant 83 yo RH man with a history of hypertension, cardiomyopathy, atrial fibrillation, with memory loss. [name] provided majority of the history as he cannot recall some of this. He had been living alone and family had been unaware of memory changes until he was admitted to [facility] in September 2016. Records reviewed, he is somewhat reclusive but family had been unable to reach him by phone for over a week and sent a neighbor to check on him. His neighbor found him tired and sluggish, but he sent his neighbor away. EMS was called to his house, and he was found confused, lethargic, and hypoxic. He was in atrial fibrillation on arrival to the ER, with CHF and pneumonia. He was discharged to a SNF then is now living in assisted living at [facility]. While he was admitted to the hospital, his son found out that since January 2016 he had been doing very damaging decision-making which had left him with hardly any finances. He was scanned on the phone and had been giving  enormous amounts of money to mail and phone solicitations. His family found out that his bank had called him to stop giving money, but the scammers were persistent and told him to use a different money wiring agency. He continued to wire money until he was admitted to the hospital. He has not been driving since his hospitalization, but denies getting lost driving previously. His wife reports he would get confused when he is a passenger in his car, afraid they would have an accident. He had not been taking any medications prior to his hospital stay, and his ex-wife feels he would not be able to dispense his medications on a daily basis. He is a retired English as a second language teacher, but now plays unrecognizable music on the piano, unaware he is playing the wrong notes or incorrect keys. Sometimes his hands hover over the keyboard before playing. He cannot seem to follow instructions even if they are noted on his calendar, calling family repeatedly and needing constant reassurance and reminders. His wife also reports personality changes, he has some paranoia and has become very rigid in his routine "to the point of neurosis." When his children from out of state come to town, he wants to know the exact hour and repeatedly asks about it. He is extremely organized but has difficulty operating the phone, such as how to adjust the volume. He is able to dress and bathe independently.  I personally reviewed MRI brain without contrast done 02/01/16 which showed moderate ventricular  enlargement predominantly involving the lateral and third ventricle. Although there is advanced atrophy, mild hydrocephalus is also considered. There is mild chronic microvascular disease. He does not have any other symptoms of NPH (no urinary incontinence or magnetic gait). He ambulates with a walker.  PAST MEDICAL HISTORY: Past Medical History:  Diagnosis Date  . Alcohol abuse   . Atrial fibrillation (HCC)   . CHF (congestive  heart failure) (HCC)   . Dementia   . GERD (gastroesophageal reflux disease)   . History of hiatal hernia   . Hypertension     MEDICATIONS: Current Outpatient Medications on File Prior to Visit  Medication Sig Dispense Refill  . acyclovir (ZOVIRAX) 400 MG tablet Take 400 mg by mouth 2 (two) times daily.    . Ascorbic Acid (VITAMIN C PO) Take 500 mg by mouth daily.     Marland Kitchen aspirin 81 MG tablet Take 81 mg by mouth daily.    . Cholecalciferol (VITAMIN D PO) Take 1,000 Units by mouth daily.     . Docosanol (ABREVA) 10 % CREA Apply 1 application topically as directed.    . donepezil (ARICEPT) 10 MG tablet Take 10 mg by mouth at bedtime.    . furosemide (LASIX) 20 MG tablet Take 10 mg by mouth. Every Sun, Tue, Thur, Sat    . hydrALAZINE (APRESOLINE) 10 MG tablet Take 1 tablet (10 mg total) by mouth every 8 (eight) hours. 90 tablet 1  . lisinopril (PRINIVIL,ZESTRIL) 20 MG tablet Take 20 mg by mouth 2 (two) times daily.    . methimazole (TAPAZOLE) 5 MG tablet     . acetaminophen (TYLENOL) 500 MG tablet Take 500 mg by mouth as directed.     No current facility-administered medications on file prior to visit.     ALLERGIES: No Known Allergies  FAMILY HISTORY: Family History  Problem Relation Age of Onset  . Brain cancer Mother   . Aneurysm Father   . Diabetes Sister   . Diabetes Brother     SOCIAL HISTORY: Social History   Socioeconomic History  . Marital status: Divorced    Spouse name: Not on file  . Number of children: 2  . Years of education: Not on file  . Highest education level: Not on file  Social Needs  . Financial resource strain: Not on file  . Food insecurity - worry: Not on file  . Food insecurity - inability: Not on file  . Transportation needs - medical: Not on file  . Transportation needs - non-medical: Not on file  Occupational History  . Occupation: Retired  Tobacco Use  . Smoking status: Former Smoker    Packs/day: 1.00    Years: 53.00    Pack years:  53.00    Types: Cigarettes    Last attempt to quit: 09/13/2015    Years since quitting: 2.2  . Smokeless tobacco: Never Used  Substance and Sexual Activity  . Alcohol use: Yes    Alcohol/week: 0.0 oz    Comment: 02/24/2016 "stopped 3 cocktails/day ~ 08/2015"  . Drug use: No  . Sexual activity: Not Currently  Other Topics Concern  . Not on file  Social History Narrative  . Not on file    REVIEW OF SYSTEMS: Constitutional: No fevers, chills, or sweats, no generalized fatigue, change in appetite Eyes: No visual changes, double vision, eye pain Ear, nose and throat: No hearing loss, ear pain, nasal congestion, sore throat Cardiovascular: No chest pain, palpitations Respiratory:  No shortness of breath  at rest or with exertion, wheezes GastrointestinaI: No nausea, vomiting, diarrhea, abdominal pain, fecal incontinence Genitourinary:  No dysuria, urinary retention or frequency Musculoskeletal:  No neck pain, back pain Integumentary: No rash, pruritus, skin lesions Neurological: as above Psychiatric: No depression, insomnia, anxiety Endocrine: No palpitations, fatigue, diaphoresis, mood swings, change in appetite, change in weight, increased thirst Hematologic/Lymphatic:  No anemia, purpura, petechiae. Allergic/Immunologic: no itchy/runny eyes, nasal congestion, recent allergic reactions, rashes  PHYSICAL EXAM: Vitals:   12/25/17 1120  BP: 108/66  Pulse: (!) 126  SpO2: 96%   General: No acute distress Head:  Normocephalic/atraumatic Neck: supple, no paraspinal tenderness, full range of motion Heart:  Regular rate and rhythm Lungs:  Clear to auscultation bilaterally Back: No paraspinal tenderness Skin/Extremities: No rash, no edema Neurological Exam: alert and oriented to person, place, and states it is January 2013. No aphasia or dysarthria. Fund of knowledge is appropriate.  Recent and remote memory are impaired.  Attention and concentration are normal.    Able to name objects  and repeat phrases. CDT 5/5 MMSE - Mini Mental State Exam 12/25/2017  Orientation to time 2  Orientation to Place 5  Registration 3  Attention/ Calculation 5  Recall 0  Language- name 2 objects 2  Language- repeat 1  Language- follow 3 step command 3  Language- read & follow direction 1  Write a sentence 1  Copy design 1  Total score 24   Cranial nerves: Pupils equal, round, reactive to light.  Extraocular movements intact with no nystagmus. Visual fields full. Facial sensation intact. No facial asymmetry. Tongue, uvula, palate midline.  Motor: Bulk and tone normal, muscle strength 5/5 throughout with no pronator drift.  Sensation to light touch intact.  No extinction to double simultaneous stimulation.  Deep tendon reflexes 2+ throughout, toes downgoing.  Finger to nose testing intact.  Gait slow and cautious with walker, no magnetic gait noted. Romberg negative.  IMPRESSION: This is a very pleasant 83 yo RH man with mild dementia. He was was admitted in September 2016 for altered mental status, found to have atrial fibrillation, CHF, and pneumonia. He had not been taking any medications prior to his hospitalization. His family has found out that he had been scammed and giving large amounts of money away since January 2016, unable to take care of his finances. MMSE today 24/30. Continue Aricept 10mg  daily. MRI brain showed ventricular enlargement, as well as advance atrophy. No other signs of NPH aside from dementia. Continue to monitor. He lives in a facility that administers his medications, he does not drive. Continue supportive care. We again discussed the importance of control of vascular risk factors, physical exercise, and brain stimulation exercises for brain health. He will follow-up in 1 year and knows to call for any changes.   Thank you for allowing me to participate in his care.  Please do not hesitate to call for any questions or concerns.  The duration of this appointment visit  was 25 minutes of face-to-face time with the patient.  Greater than 50% of this time was spent in counseling, explanation of diagnosis, planning of further management, and coordination of care.   Patrcia Dolly, M.D.   CC: Dr. Redmond School

## 2017-12-31 ENCOUNTER — Encounter: Payer: Self-pay | Admitting: Neurology

## 2018-01-13 NOTE — Progress Notes (Signed)
Cardiology Office Note   Date:  01/15/2018   ID:  Mark Mullen, DOB 1935/04/14, MRN [id number]  PCP:  Florentina Jenny, MD  Cardiologist:   Rollene Rotunda, MD   Chief Complaint  Patient presents with  . Atrial Fibrillation      History of Present Illness: Mark Mullen is a 83 y.o. male who presents for evaluation of cardiomyopathy. He was in the hospital in 2016 with pneumonia and sepsis. He had atrial fibrillation with a rapid rate. He did have elevated cardiac enzymes. He was also found to have a cardiomyopathy with an EF of about 25% with diffuse hypokinesis. Follow up stress perfusion study demonstrated a fixed inferior and inferoapical perfusion defect suggestive of scar. I decided to manage him medically.  Follow up echo in 2017 demonstrated that the EF had returned to normal.  He has also had bradycardia.    He presents for routine follow-up today and does not notice that he is actually in atrial flutter with variable conduction. He says he occasionally feels palpitations but he doesn't today. He lives at a nursing home and walks to the dining hall 3 times a day. He has been having problems with this.  He denies any falls. He's had no palpitations, presyncope or syncope. He denies any chest pain or arm discomfort. He's had no weight gain or edema.   Past Medical History:  Diagnosis Date  . Alcohol abuse   . Atrial fibrillation (HCC)   . CHF (congestive heart failure) (HCC)   . Dementia   . GERD (gastroesophageal reflux disease)   . History of hiatal hernia   . Hypertension     Past Surgical History:  Procedure Laterality Date  . EYE SURGERY Right   . TONSILLECTOMY       Current Outpatient Medications  Medication Sig Dispense Refill  . acetaminophen (TYLENOL) 500 MG tablet Take 500 mg by mouth as directed.    Marland Kitchen acyclovir (ZOVIRAX) 400 MG tablet Take 400 mg by mouth daily.     . Ascorbic Acid (VITAMIN C PO) Take 500 mg by mouth daily.     . Cholecalciferol (VITAMIN D  PO) Take 1,000 Units by mouth daily.     . Docosanol (ABREVA) 10 % CREA Apply 1 application topically as directed.    . donepezil (ARICEPT) 10 MG tablet Take 10 mg by mouth at bedtime.    . furosemide (LASIX) 20 MG tablet Take 10 mg by mouth. Every Sun, Tue, Thur, Sat    . hydrALAZINE (APRESOLINE) 10 MG tablet Take 1 tablet (10 mg total) by mouth every 8 (eight) hours. 90 tablet 1  . lisinopril (PRINIVIL,ZESTRIL) 20 MG tablet Take 20 mg by mouth 2 (two) times daily.    . methimazole (TAPAZOLE) 5 MG tablet Take 5 mg by mouth daily.     Marland Kitchen apixaban (ELIQUIS) 5 MG TABS tablet Take 1 tablet (5 mg total) by mouth 2 (two) times daily. 60 tablet 11   No current facility-administered medications for this visit.     Allergies:   Patient has no known allergies.    ROS:  Please see the history of present illness.   Otherwise, review of systems are positive for none.   All other systems are reviewed and negative.    PHYSICAL EXAM: VS:  BP 122/64   Pulse (!) 103   Ht 5\' 9"  (1.753 m)   Wt 167 lb (75.8 kg)   BMI 24.66 kg/m  , BMI Body mass index  is 24.66 kg/m.  GENERAL:  Somewhat frail appearing NECK:  No jugular venous distention, waveform within normal limits, carotid upstroke brisk and symmetric, no bruits, no thyromegaly LUNGS:  Clear to auscultation bilaterally CHEST:  Unremarkable HEART:  PMI not displaced or sustained,S1 and S2 within normal limits, no S3, no clicks, no rubs, no murmurs ABD:  Flat, positive bowel sounds normal in frequency in pitch, no bruits, no rebound, no guarding, no midline pulsatile mass, no hepatomegaly, no splenomegaly EXT:  2 plus pulses throughout, no edema, no cyanosis no clubbing   EKG:  EKG is   ordered today. Atrial flutter with variable conduction, right bundle branch block, right axis deviation.  Recent Labs: No results found for requested labs within last 8760 hours.    Lipid Panel No results found for: CHOL, TRIG, HDL, CHOLHDL, VLDL, LDLCALC,  LDLDIRECT    Wt Readings from Last 3 Encounters:  01/15/18 167 lb (75.8 kg)  12/25/17 166 lb (75.3 kg)  11/16/16 167 lb (75.8 kg)      Other studies Reviewed: Additional studies/ records that were reviewed today include: None Review of the above records demonstrates:    ASSESSMENT AND PLAN:  Cardiomyopathy -  The patient's ejection fraction had improved. I suspect this is related to medication titration being off alcohol. No further evaluation is planned at this point.Marland Kitchen  PAF (paroxysmal atrial fibrillation) (HCC)/flutter CHADS VASc=3 .   He is now in this rhythm and I will stop his aspirin and start Elavil) 5 mg twice daily. He has no bleeding contraindications and he's not falling. His CBC edema. I'm going to apply a 48 hour Holter to make sure he has good rate control and to see whether it's a persistent rhythm.I like him to come back in a couple of weeks for follow-up blood work . BRADYCARDIA At this point he's had a history of bradycardia so I'm going to be reluctant to titrate any meds for rate control and less he significantly el  HTN The blood pressure is at target. No change in medications is indicated. We will continue with therapeutic lifestyle changes (TLC).  Current medicines are reviewed at length with the patient today.  The patient does not have concerns regarding medicines.  The following changes have been made:  As above  Labs/ tests ordered today include:   Orders Placed This Encounter  Procedures  . CBC  . Basic Metabolic Panel (BMET)  . HOLTER MONITOR - 48 HOUR  . EKG 12-Lead     Disposition:   FU with me APP in two months.  Signed, Rollene Rotunda, MD  01/15/2018 5:42 PM    [facility]

## 2018-01-15 ENCOUNTER — Ambulatory Visit (INDEPENDENT_AMBULATORY_CARE_PROVIDER_SITE_OTHER): Payer: Medicare Other | Admitting: Cardiology

## 2018-01-15 ENCOUNTER — Encounter: Payer: Self-pay | Admitting: Cardiology

## 2018-01-15 VITALS — BP 122/64 | HR 103 | Ht 69.0 in | Wt 167.0 lb

## 2018-01-15 DIAGNOSIS — Z79899 Other long term (current) drug therapy: Secondary | ICD-10-CM

## 2018-01-15 DIAGNOSIS — I480 Paroxysmal atrial fibrillation: Secondary | ICD-10-CM

## 2018-01-15 DIAGNOSIS — R001 Bradycardia, unspecified: Secondary | ICD-10-CM | POA: Diagnosis not present

## 2018-01-15 DIAGNOSIS — I10 Essential (primary) hypertension: Secondary | ICD-10-CM

## 2018-01-15 MED ORDER — APIXABAN 5 MG PO TABS: 5.0000 mg | ORAL_TABLET | Freq: Two times a day (BID) | ORAL | 11 refills | Status: DC

## 2018-01-15 NOTE — Patient Instructions (Signed)
Medication Instructions:  STOP Aspirin START- Eliquis 5 mg twice a day  If you need a refill on your cardiac medications before your next appointment, please call your pharmacy.  Labwork: CBC and BMP Today HERE IN OUR OFFICE AT [facility]  Take the provided lab slips for you to take with you to the lab for you blood draw.   You will NOT need to fast   You may go to any [facility] lab that is convenient for you however, we do have a lab in our office that is able to assist you. You do NOT need an appointment for our lab. Once in our office lobby there is a podium to the right of the check-in desk where you are to sign-in and ring a doorbell to alert [place] you are here. Lab is open Monday-Friday from 8:00am to 4:00pm; and is closed for lunch from 12:45p-1:45pm   Testing/Procedures: Your physician has recommended that you wear a holter monitor for 48 hour. Holter monitors are medical devices that record the heart's electrical activity. Doctors most often use these monitors to diagnose arrhythmias. Arrhythmias are problems with the speed or rhythm of the heartbeat. The monitor is a small, portable device. You can wear one while you do your normal daily activities. This is usually used to diagnose what is causing palpitations/syncope (passing out).   Follow-Up: Your physician wants you to follow-up in: 2 Weeks with APP.    Thank you for choosing [facility] at [facility]!!

## 2018-01-16 LAB — CBC
Hematocrit: 43.7 % (ref 37.5–51.0)
Hemoglobin: 15.2 g/dL (ref 13.0–17.7)
MCH: 30.9 pg (ref 26.6–33.0)
MCHC: 34.8 g/dL (ref 31.5–35.7)
MCV: 89 fL (ref 79–97)
Platelets: 242 10*3/uL (ref 150–379)
RBC: 4.92 x10E6/uL (ref 4.14–5.80)
RDW: 14.1 % (ref 12.3–15.4)
WBC: 9.7 10*3/uL (ref 3.4–10.8)

## 2018-01-16 LAB — BASIC METABOLIC PANEL
BUN/Creatinine Ratio: 17 (ref 10–24)
BUN: 32 mg/dL — AB (ref 8–27)
CHLORIDE: 104 mmol/L (ref 96–106)
CO2: 20 mmol/L (ref 20–29)
CREATININE: 1.9 mg/dL — AB (ref 0.76–1.27)
Calcium: 9.7 mg/dL (ref 8.6–10.2)
GFR calc Af Amer: 38 mL/min/{1.73_m2} — ABNORMAL LOW (ref >59)
GFR calc non Af Amer: 33 mL/min/{1.73_m2} — ABNORMAL LOW (ref >59)
GLUCOSE: 102 mg/dL — AB (ref 65–99)
Potassium: 5 mmol/L (ref 3.5–5.2)
SODIUM: 142 mmol/L (ref 134–144)

## 2018-01-31 ENCOUNTER — Ambulatory Visit (INDEPENDENT_AMBULATORY_CARE_PROVIDER_SITE_OTHER): Payer: Medicare Other

## 2018-01-31 ENCOUNTER — Telehealth: Payer: Self-pay | Admitting: Cardiology

## 2018-01-31 DIAGNOSIS — I480 Paroxysmal atrial fibrillation: Secondary | ICD-10-CM | POA: Diagnosis not present

## 2018-01-31 NOTE — Telephone Encounter (Signed)
Tresa Endo calling with [facility], states that patient AVS states that he should return monitor on Monday, Tresa Endo states that they would be able to return it on Tuesday.

## 2018-02-01 ENCOUNTER — Ambulatory Visit: Payer: Medicare Other | Admitting: Physician Assistant

## 2018-02-05 ENCOUNTER — Encounter: Payer: Self-pay | Admitting: Physician Assistant

## 2018-02-05 ENCOUNTER — Ambulatory Visit (INDEPENDENT_AMBULATORY_CARE_PROVIDER_SITE_OTHER): Payer: Medicare Other | Admitting: Physician Assistant

## 2018-02-05 VITALS — BP 122/72 | HR 61 | Ht 69.0 in | Wt 168.6 lb

## 2018-02-05 DIAGNOSIS — N289 Disorder of kidney and ureter, unspecified: Secondary | ICD-10-CM

## 2018-02-05 DIAGNOSIS — I4892 Unspecified atrial flutter: Secondary | ICD-10-CM | POA: Diagnosis not present

## 2018-02-05 DIAGNOSIS — Z79899 Other long term (current) drug therapy: Secondary | ICD-10-CM

## 2018-02-05 DIAGNOSIS — I428 Other cardiomyopathies: Secondary | ICD-10-CM | POA: Diagnosis not present

## 2018-02-05 NOTE — Progress Notes (Signed)
Cardiology Office Note    Date:  02/07/2018   ID:  Mark Mullen, DOB January 04, 1935, MRN [id number]  PCP:  Florentina Jenny, MD  Cardiologist:  Dr. Antoine Poche  Chief Complaint  Patient presents with  . Follow-up    seen for Dr. Antoine Poche, recent atrial flutter    History of Present Illness:  Mark Mullen is a 83 y.o. male with PMH of NICM with improved EF, PAF, GERD, dementia, and h/o EtOH abuse.  Patient was admitted to the hospital in 2016 with pneumonia and sepsis.  He was found to have EF of 25% on echocardiogram.  He was also noted to have atrial fibrillation with RVR.  Follow-up stress Myoview demonstrated fixed inferior and inferoapical perfusion defect suggestive of scar.  He was managed medically.  Follow-up echocardiogram in 2017 demonstrated improved ejection fraction of 50-55%, severe LVH.  He also had a history of significant bradycardia.  He was most recently seen by Dr. Antoine Poche on 01/15/2018, it was noted he was actually in atrial flutter with variable conduction on that day.  His aspirin was discontinued and he was switched to Eliquis 5 mg twice daily.  CBC and basic metabolic panel was obtained on that day.  Creatinine came back 1.9 which has significantly elevated since the last lab work in March 2017.  Lasix was stopped, Eliquis was reduced to 2.5 mg twice daily.  He was placed on 48-hour Holter monitor.   He presents today for cardiology office visit.  He denies any cardiac awareness of atrial flutter.  His heart rate is still irregular.  Apparently his driver still has his heart monitor, therefore we will unable to see the monitor result.  However based on physical exam findings, his heart rate is very well controlled on no AV nodal blocking agent.  We will continue on current therapy.  I will also obtain a CBC and basic metabolic panel.  If creatinine is less than 1.5, I plan to increase his Eliquis to 5 mg twice daily.  Otherwise he is maintaining euvolemic status off of Lasix.  He  can follow-up with Dr. Antoine Poche in 2 months.   Past Medical History:  Diagnosis Date  . Alcohol abuse   . Atrial fibrillation (HCC)   . CHF (congestive heart failure) (HCC)   . Dementia   . GERD (gastroesophageal reflux disease)   . History of hiatal hernia   . Hypertension     Past Surgical History:  Procedure Laterality Date  . EYE SURGERY Right   . TONSILLECTOMY      Current Medications: Outpatient Medications Prior to Visit  Medication Sig Dispense Refill  . acyclovir (ZOVIRAX) 400 MG tablet Take 400 mg by mouth daily.     . Ascorbic Acid (VITAMIN C PO) Take 500 mg by mouth daily.     . Cholecalciferol (VITAMIN D PO) Take 1,000 Units by mouth daily.     . Docosanol (ABREVA) 10 % CREA Apply 1 application topically as directed.    . donepezil (ARICEPT) 10 MG tablet Take 10 mg by mouth at bedtime.    Marland Kitchen ELIQUIS 2.5 MG TABS tablet     . hydrALAZINE (APRESOLINE) 10 MG tablet Take 1 tablet (10 mg total) by mouth every 8 (eight) hours. 90 tablet 1  . lisinopril (PRINIVIL,ZESTRIL) 20 MG tablet Take 20 mg by mouth 2 (two) times daily.    . methimazole (TAPAZOLE) 5 MG tablet Take 5 mg by mouth daily.     Marland Kitchen acetaminophen (TYLENOL) 500 MG  tablet Take 500 mg by mouth as directed.    Marland Kitchen apixaban (ELIQUIS) 5 MG TABS tablet Take 1 tablet (5 mg total) by mouth 2 (two) times daily. (Patient not taking: Reported on 02/05/2018) 60 tablet 11  . furosemide (LASIX) 20 MG tablet Take 10 mg by mouth. Every Sun, Tue, Thur, Sat     No facility-administered medications prior to visit.      Allergies:   Patient has no known allergies.   Social History   Socioeconomic History  . Marital status: Divorced    Spouse name: None  . Number of children: 2  . Years of education: None  . Highest education level: None  Social Needs  . Financial resource strain: None  . Food insecurity - worry: None  . Food insecurity - inability: None  . Transportation needs - medical: None  . Transportation needs -  non-medical: None  Occupational History  . Occupation: Retired  Tobacco Use  . Smoking status: Former Smoker    Packs/day: 1.00    Years: 53.00    Pack years: 53.00    Types: Cigarettes    Last attempt to quit: 09/13/2015    Years since quitting: 2.4  . Smokeless tobacco: Never Used  Substance and Sexual Activity  . Alcohol use: Yes    Alcohol/week: 0.0 oz    Comment: 02/24/2016 "stopped 3 cocktails/day ~ 08/2015"  . Drug use: No  . Sexual activity: Not Currently  Other Topics Concern  . None  Social History Narrative  . None     Family History:  The patient's family history includes Aneurysm in his father; Brain cancer in his mother; Diabetes in his brother and sister.   ROS:   Please see the history of present illness.    ROS All other systems reviewed and are negative.   PHYSICAL EXAM:   VS:  BP 122/72 (BP Location: Left Arm, Patient Position: Sitting, Cuff Size: Normal)   Pulse 61   Ht 5\' 9"  (1.753 m)   Wt 168 lb 9.6 oz (76.5 kg)   SpO2 97%   BMI 24.90 kg/m    GEN: Well nourished, well developed, in no acute distress  HEENT: normal  Neck: no JVD, carotid bruits, or masses Cardiac: irregular; no murmurs, rubs, or gallops,no edema  Respiratory:  clear to auscultation bilaterally, normal work of breathing GI: soft, nontender, nondistended, + BS MS: no deformity or atrophy  Skin: warm and dry, no rash Neuro:  Alert and Oriented x 3, Strength and sensation are intact Psych: euthymic mood, full affect  Wt Readings from Last 3 Encounters:  02/05/18 168 lb 9.6 oz (76.5 kg)  01/15/18 167 lb (75.8 kg)  12/25/17 166 lb (75.3 kg)      Studies/Labs Reviewed:   EKG:  EKG is not ordered today.    Recent Labs: 02/05/2018: BUN 19; Creatinine, Ser 1.71; Hemoglobin 15.3; Platelets 231; Potassium 4.8; Sodium 144   Lipid Panel No results found for: CHOL, TRIG, HDL, CHOLHDL, VLDL, LDLCALC, LDLDIRECT  Additional studies/ records that were reviewed today include:   Echo  08/03/2016 LV EF: 50% -   55% Study Conclusions  - Left ventricle: Abnormal septal motion The cavity size was mildly   dilated. Wall thickness was increased in a pattern of severe LVH.   Systolic function was normal. The estimated ejection fraction was   in the range of 50% to 55%. Wall motion was normal; there were no   regional wall motion abnormalities. Left ventricular diastolic  function parameters were normal. - Atrial septum: No defect or patent foramen ovale was identified.   ASSESSMENT:    1. Atrial flutter, unspecified type (HCC)   2. Encounter for long-term (current) use of medications   3. Acute renal insufficiency   4. NICM (nonischemic cardiomyopathy) (HCC)      PLAN:  In order of problems listed above:  1. Atrial flutter: Eliquis was reduced to 2.5 mg twice daily recently due to poor renal function.  Since then, his diuretic has been discontinued.  I will obtain a basic metabolic panel today, if creatinine is less than 1.5, can potentially increase to Eliquis to 5 mg twice daily.  Otherwise his heart rate is self rate controlled on no AV nodal blocking agent.  CBC to check for any anemia on Eliquis.  2. Acute renal insufficiency: Recent creatinine went up to 1.9, his diuretic has been discontinued.  He is maintaining euvolemic status.  I will repeat a basic metabolic panel  3. NICM: EF improved to normal on the last echocardiogram in 2017    Medication Adjustments/Labs and Tests Ordered: Current medicines are reviewed at length with the patient today.  Concerns regarding medicines are outlined above.  Medication changes, Labs and Tests ordered today are listed in the Patient Instructions below. Patient Instructions  Your physician recommends that you continue on your current medications as directed. Please refer to the Current Medication list given to you today.   Your physician recommends that you return for lab work in:  TODAY  BMET AND  CBC   Your  physician recommends that you schedule a follow-up appointment in:  3 MONTHS WITH DR Nyoka Lint, PA  02/07/2018 1:02 AM    [facility] [address] [place], [place], [place]  [address] Phone: [phone]; Fax: [phone]

## 2018-02-05 NOTE — Patient Instructions (Addendum)
Your physician recommends that you continue on your current medications as directed. Please refer to the Current Medication list given to you today.   Your physician recommends that you return for lab work in:  TODAY  BMET AND  CBC   Your physician recommends that you schedule a follow-up appointment in:  3 MONTHS WITH DR [facility]

## 2018-02-06 LAB — BASIC METABOLIC PANEL
BUN/Creatinine Ratio: 11 (ref 10–24)
BUN: 19 mg/dL (ref 8–27)
CALCIUM: 9.6 mg/dL (ref 8.6–10.2)
CHLORIDE: 105 mmol/L (ref 96–106)
CO2: 22 mmol/L (ref 20–29)
Creatinine, Ser: 1.71 mg/dL — ABNORMAL HIGH (ref 0.76–1.27)
GFR calc Af Amer: 43 mL/min/{1.73_m2} — ABNORMAL LOW (ref >59)
GFR calc non Af Amer: 37 mL/min/{1.73_m2} — ABNORMAL LOW (ref >59)
Glucose: 122 mg/dL — ABNORMAL HIGH (ref 65–99)
POTASSIUM: 4.8 mmol/L (ref 3.5–5.2)
Sodium: 144 mmol/L (ref 134–144)

## 2018-02-06 LAB — CBC WITH DIFFERENTIAL/PLATELET
BASOS ABS: 0 10*3/uL (ref 0.0–0.2)
Basos: 0 %
EOS (ABSOLUTE): 0.2 10*3/uL (ref 0.0–0.4)
Eos: 3 %
HEMOGLOBIN: 15.3 g/dL (ref 13.0–17.7)
Hematocrit: 47.3 % (ref 37.5–51.0)
IMMATURE GRANS (ABS): 0 10*3/uL (ref 0.0–0.1)
IMMATURE GRANULOCYTES: 1 %
LYMPHS: 32 %
Lymphocytes Absolute: 2.3 10*3/uL (ref 0.7–3.1)
MCH: 30.4 pg (ref 26.6–33.0)
MCHC: 32.3 g/dL (ref 31.5–35.7)
MCV: 94 fL (ref 79–97)
MONOCYTES: 11 %
Monocytes Absolute: 0.8 10*3/uL (ref 0.1–0.9)
NEUTROS PCT: 53 %
Neutrophils Absolute: 3.8 10*3/uL (ref 1.4–7.0)
PLATELETS: 231 10*3/uL (ref 150–379)
RBC: 5.04 x10E6/uL (ref 4.14–5.80)
RDW: 14.2 % (ref 12.3–15.4)
WBC: 7.2 10*3/uL (ref 3.4–10.8)

## 2018-02-07 ENCOUNTER — Encounter: Payer: Self-pay | Admitting: Physician Assistant

## 2018-02-07 NOTE — Progress Notes (Signed)
Kidney function improved slightly, but still not at baseline. Recommend repeat BMET in 2-3 weeks, once his Cr is < 1.5, I likely will increase his eliquis to 5mg  BID.

## 2018-02-11 ENCOUNTER — Telehealth: Payer: Self-pay | Admitting: Cardiology

## 2018-02-11 NOTE — Telephone Encounter (Signed)
New message ° °Pt son verbalized that he is returning call for RN °

## 2018-02-12 ENCOUNTER — Other Ambulatory Visit: Payer: Self-pay

## 2018-02-12 DIAGNOSIS — I5042 Chronic combined systolic (congestive) and diastolic (congestive) heart failure: Secondary | ICD-10-CM

## 2018-02-12 NOTE — Telephone Encounter (Signed)
Follow Up   Pt son returning call for nurse. Please call

## 2018-02-12 NOTE — Telephone Encounter (Signed)
Returned call to son (DPR). Notified him of results.   Notes recorded by Azalee Course, PA on 02/07/2018 at 5:42 PM EST Kidney function improved slightly, but still not at baseline. Recommend repeat BMET in 2-3 weeks, once his Cr is < 1.5, I likely will increase his eliquis to 5mg  BID.    Routed to [place], CMA to f/up on repeat lab work/orders

## 2018-03-19 ENCOUNTER — Emergency Department (HOSPITAL_COMMUNITY): Payer: Medicare Other

## 2018-03-19 ENCOUNTER — Encounter (HOSPITAL_COMMUNITY): Payer: Self-pay | Admitting: Emergency Medicine

## 2018-03-19 ENCOUNTER — Other Ambulatory Visit: Payer: Self-pay

## 2018-03-19 ENCOUNTER — Emergency Department (HOSPITAL_COMMUNITY)
Admission: EM | Admit: 2018-03-19 | Discharge: 2018-03-19 | Disposition: A | Discharge status: Home / Self Care | Payer: Medicare Other | Source: Home / Self Care | Attending: Emergency Medicine | Admitting: Emergency Medicine

## 2018-03-19 ENCOUNTER — Emergency Department (HOSPITAL_COMMUNITY)
Admission: EM | Admit: 2018-03-19 | Discharge: 2018-03-20 | Disposition: A | Discharge status: Home / Self Care | Payer: Medicare Other | Attending: Emergency Medicine | Admitting: Emergency Medicine

## 2018-03-19 DIAGNOSIS — Z87891 Personal history of nicotine dependence: Secondary | ICD-10-CM

## 2018-03-19 DIAGNOSIS — S01112A Laceration without foreign body of left eyelid and periocular area, initial encounter: Secondary | ICD-10-CM | POA: Insufficient documentation

## 2018-03-19 DIAGNOSIS — I110 Hypertensive heart disease with heart failure: Secondary | ICD-10-CM

## 2018-03-19 DIAGNOSIS — Y939 Activity, unspecified: Secondary | ICD-10-CM

## 2018-03-19 DIAGNOSIS — Y92129 Unspecified place in nursing home as the place of occurrence of the external cause: Secondary | ICD-10-CM

## 2018-03-19 DIAGNOSIS — R269 Unspecified abnormalities of gait and mobility: Secondary | ICD-10-CM | POA: Diagnosis not present

## 2018-03-19 DIAGNOSIS — W19XXXA Unspecified fall, initial encounter: Secondary | ICD-10-CM | POA: Insufficient documentation

## 2018-03-19 DIAGNOSIS — S0101XA Laceration without foreign body of scalp, initial encounter: Secondary | ICD-10-CM

## 2018-03-19 DIAGNOSIS — R509 Fever, unspecified: Secondary | ICD-10-CM | POA: Diagnosis not present

## 2018-03-19 DIAGNOSIS — Y999 Unspecified external cause status: Secondary | ICD-10-CM

## 2018-03-19 DIAGNOSIS — F0390 Unspecified dementia without behavioral disturbance: Secondary | ICD-10-CM | POA: Insufficient documentation

## 2018-03-19 DIAGNOSIS — Z79899 Other long term (current) drug therapy: Secondary | ICD-10-CM | POA: Insufficient documentation

## 2018-03-19 DIAGNOSIS — I5042 Chronic combined systolic (congestive) and diastolic (congestive) heart failure: Secondary | ICD-10-CM

## 2018-03-19 DIAGNOSIS — M6281 Muscle weakness (generalized): Secondary | ICD-10-CM | POA: Diagnosis present

## 2018-03-19 DIAGNOSIS — R531 Weakness: Secondary | ICD-10-CM

## 2018-03-19 DIAGNOSIS — Z7901 Long term (current) use of anticoagulants: Secondary | ICD-10-CM | POA: Insufficient documentation

## 2018-03-19 LAB — URINALYSIS, ROUTINE W REFLEX MICROSCOPIC
Bilirubin Urine: NEGATIVE
GLUCOSE, UA: NEGATIVE mg/dL
HGB URINE DIPSTICK: NEGATIVE
KETONES UR: 20 mg/dL — AB
Leukocytes, UA: NEGATIVE
Nitrite: NEGATIVE
PROTEIN: NEGATIVE mg/dL
Specific Gravity, Urine: 1.024 (ref 1.005–1.030)
pH: 5 (ref 5.0–8.0)

## 2018-03-19 LAB — CBC WITH DIFFERENTIAL/PLATELET
BASOS ABS: 0 10*3/uL (ref 0.0–0.1)
Basophils Relative: 0 %
Eosinophils Absolute: 0 10*3/uL (ref 0.0–0.7)
Eosinophils Relative: 0 %
HCT: 51.4 % (ref 39.0–52.0)
Hemoglobin: 16.2 g/dL (ref 13.0–17.0)
Lymphocytes Relative: 16 %
Lymphs Abs: 1.1 10*3/uL (ref 0.7–4.0)
MCH: 29.5 pg (ref 26.0–34.0)
MCHC: 31.5 g/dL (ref 30.0–36.0)
MCV: 93.5 fL (ref 78.0–100.0)
MONO ABS: 0.7 10*3/uL (ref 0.1–1.0)
Monocytes Relative: 10 %
Neutro Abs: 5.2 10*3/uL (ref 1.7–7.7)
Neutrophils Relative %: 74 %
Platelets: 184 10*3/uL (ref 150–400)
RBC: 5.5 MIL/uL (ref 4.22–5.81)
RDW: 14.1 % (ref 11.5–15.5)
WBC: 7 10*3/uL (ref 4.0–10.5)

## 2018-03-19 LAB — COMPREHENSIVE METABOLIC PANEL
ALBUMIN: 3.7 g/dL (ref 3.5–5.0)
ALT: 25 U/L (ref 17–63)
AST: 33 U/L (ref 15–41)
Alkaline Phosphatase: 50 U/L (ref 38–126)
Anion gap: 10 (ref 5–15)
BILIRUBIN TOTAL: 0.6 mg/dL (ref 0.3–1.2)
BUN: 25 mg/dL — AB (ref 6–20)
CALCIUM: 9.4 mg/dL (ref 8.9–10.3)
CO2: 21 mmol/L — ABNORMAL LOW (ref 22–32)
Chloride: 108 mmol/L (ref 101–111)
Creatinine, Ser: 1.63 mg/dL — ABNORMAL HIGH (ref 0.61–1.24)
GFR calc Af Amer: 44 mL/min — ABNORMAL LOW (ref >60)
GFR calc non Af Amer: 38 mL/min — ABNORMAL LOW (ref >60)
GLUCOSE: 141 mg/dL — AB (ref 65–99)
Potassium: 3.9 mmol/L (ref 3.5–5.1)
Sodium: 139 mmol/L (ref 135–145)
TOTAL PROTEIN: 6.5 g/dL (ref 6.5–8.1)

## 2018-03-19 LAB — I-STAT TROPONIN, ED: Troponin i, poc: 0.03 ng/mL (ref 0.00–0.08)

## 2018-03-19 LAB — I-STAT CG4 LACTIC ACID, ED: Lactic Acid, Venous: 1.5 mmol/L (ref 0.5–1.9)

## 2018-03-19 LAB — AMMONIA: Ammonia: 41 umol/L — ABNORMAL HIGH (ref 9–35)

## 2018-03-19 LAB — INFLUENZA PANEL BY PCR (TYPE A & B)
Influenza A By PCR: NEGATIVE
Influenza B By PCR: NEGATIVE

## 2018-03-19 MED ORDER — ACETAMINOPHEN 325 MG PO TABS
650.0000 mg | ORAL_TABLET | Freq: Once | ORAL | Status: AC
  Administered 2018-03-19: 650 mg via ORAL
  Filled 2018-03-19: qty 2

## 2018-03-19 MED ORDER — LIDOCAINE-EPINEPHRINE-TETRACAINE (LET) SOLUTION
3.0000 mL | Freq: Once | NASAL | Status: AC
  Administered 2018-03-19: 3 mL via TOPICAL
  Filled 2018-03-19: qty 3

## 2018-03-19 NOTE — ED Triage Notes (Signed)
Patient discharged this am for fall. Upon return patient c/o weakness and inability to stand, per facility.

## 2018-03-19 NOTE — ED Provider Notes (Signed)
[facility] EMERGENCY DEPARTMENT Provider Note   CSN: [id number] Arrival date & time: 03/19/18  1607     History   Chief Complaint Chief Complaint  Patient presents with  . Weakness    HPI Mark Mullen is a 83 y.o. male.  HPI Mark Mullen is a 84 y.o. male with history of A. fib, CHF, dementia, hypertension, history of alcohol abuse, presents from assisted living facility with complaint of generalized weakness.  Patient was seen last night for a fall.  Patient sustained a head injury, CT of the head and cervical spine were obtained and were negative.  Patient apparently was sent back, and according to EMS he was sitting on the couch she was unable to get up.  EMS was called again for transport.  Patient is demented at baseline and has no complaints.  Spoke with nursing home RN, who stated pt normally ambulates with no assist with no difficulty and today he could not walk. He complained earlier of back pain and pain "all over" and she has noticed he was coughing some.   Past Medical History:  Diagnosis Date  . Alcohol abuse   . Atrial fibrillation (HCC)   . CHF (congestive heart failure) (HCC)   . Dementia   . GERD (gastroesophageal reflux disease)   . History of hiatal hernia   . Hypertension     Patient Active Problem List   Diagnosis Date Noted  . Hypertension   . History of hiatal hernia   . GERD (gastroesophageal reflux disease)   . Dementia   . CHF (congestive heart failure) (HCC)   . Alcohol abuse   . Intractable nausea and vomiting 02/19/2016  . Diarrhea 02/19/2016  . Abnormal EKG 02/19/2016  . Chronic combined systolic and diastolic heart failure (HCC) 02/19/2016  . Mild dementia 01/19/2016  . Essential hypertension 01/19/2016  . Atrial fibrillation (HCC) 01/19/2016  . Cardiomyopathy - etiology not yet determined 10/08/2015  . RBBB 10/08/2015  . Anticoagulated 10/08/2015  . Hypertensive heart disease with CHF (congestive heart failure)  (HCC) 09/26/2015  . Vitamin D deficiency 09/26/2015  . Yeast dermatitis 09/26/2015  . Atrial fibrillation, unspecified   . Alcohol withdrawal (HCC)   . Malnutrition of moderate degree (HCC) 09/14/2015  . Severe sepsis with acute organ dysfunction (HCC) 09/13/2015  . PAF (paroxysmal atrial fibrillation) (HCC) 09/13/2015  . NSTEMI (non-ST elevated myocardial infarction) (HCC) 09/13/2015  . Acute combined systolic and diastolic congestive heart failure (HCC) 09/13/2015  . Acute respiratory failure with hypoxia and hypercapnia (HCC) 09/13/2015    Past Surgical History:  Procedure Laterality Date  . EYE SURGERY Right   . TONSILLECTOMY          Home Medications    Prior to Admission medications   Medication Sig Start Date End Date Taking? Authorizing Provider  acetaminophen (TYLENOL) 500 MG tablet Take 500 mg by mouth every 8 (eight) hours as needed (for pain).   Yes [provider]  acyclovir (ZOVIRAX) 400 MG tablet Take 400 mg by mouth daily.    Yes [provider]  Cholecalciferol (VITAMIN D-3) 1000 units CAPS Take 1,000 Units by mouth daily.   Yes [provider]  Docosanol (ABREVA) 10 % CREA Apply 1 application topically See admin instructions. Apply to groin-area lesions topically every 6 hours as needed for other herpes/viral infection   Yes [provider]  donepezil (ARICEPT) 10 MG tablet Take 10 mg by mouth daily.    Yes [provider]  ELIQUIS 2.5 MG TABS tablet Take 2.5 mg by mouth 2 (two) times daily.  01/16/18  Yes [provider]  hydrALAZINE (APRESOLINE) 10 MG tablet Take 1 tablet (10 mg total) by mouth every 8 (eight) hours. 02/25/16  Yes Hollice Espy, MD  lisinopril (PRINIVIL,ZESTRIL) 20 MG tablet Take 20 mg by mouth daily.    Yes [provider]  methimazole (TAPAZOLE) 5 MG tablet Take 5 mg by mouth daily.  12/10/17  Yes [provider]  polyethylene glycol powder (GLYCOLAX/MIRALAX) powder  Take 17 g by mouth daily as needed (for constipation).   Yes [provider]  polyvinyl alcohol (LIQUIFILM TEARS) 1.4 % ophthalmic solution Place 1 drop into both eyes every 12 (twelve) hours as needed for dry eyes.   Yes [provider]  vitamin C (ASCORBIC ACID) 500 MG tablet Take 500 mg by mouth daily.   Yes [provider]    Family History Family History  Problem Relation Age of Onset  . Brain cancer Mother   . Aneurysm Father   . Diabetes Sister   . Diabetes Brother     Social History Social History   Tobacco Use  . Smoking status: Former Smoker    Packs/day: 1.00    Years: 53.00    Pack years: 53.00    Types: Cigarettes    Last attempt to quit: 09/13/2015    Years since quitting: 2.5  . Smokeless tobacco: Never Used  Substance Use Topics  . Alcohol use: Yes    Alcohol/week: 0.0 oz    Comment: 02/24/2016 "stopped 3 cocktails/day ~ 08/2015"  . Drug use: No     Allergies   Patient has no known allergies.   Review of Systems Review of Systems  Unable to perform ROS: Dementia     Physical Exam Updated Vital Signs BP 120/83   Pulse 93   Temp (S) 100.1 F (37.8 C) (Rectal)   Resp 17   SpO2 93%   Physical Exam  Constitutional: He appears well-developed and well-nourished. No distress.  HENT:  Head: Normocephalic.  Laceration to right forehead with staples intact  Eyes: Pupils are equal, round, and reactive to light. Conjunctivae and EOM are normal.  Neck: Normal range of motion. Neck supple.  Cardiovascular: Normal rate, regular rhythm and normal heart sounds.  Pulmonary/Chest: Effort normal. No respiratory distress. He has wheezes. He has rales.  Abdominal: Soft. Bowel sounds are normal. He exhibits no distension. There is no tenderness. There is no rebound.  Musculoskeletal: He exhibits no edema.  Neurological: He is alert.  Oriented to self. Moving all extremities.   Skin: Skin is warm and dry.  Nursing note and vitals  reviewed.    ED Treatments / Results  Labs (all labs ordered are listed, but only abnormal results are displayed) Labs Reviewed  URINE CULTURE  CBC WITH DIFFERENTIAL/PLATELET  COMPREHENSIVE METABOLIC PANEL  URINALYSIS, ROUTINE W REFLEX MICROSCOPIC  AMMONIA  I-STAT CG4 LACTIC ACID, ED  I-STAT TROPONIN, ED    EKG None  Radiology Ct Head Wo Contrast  Result Date: 03/19/2018 CLINICAL DATA:  Two unwitnessed falls. RIGHT periorbital laceration. On Eliquis. History of dementia and alcohol abuse. EXAM: CT HEAD WITHOUT CONTRAST CT CERVICAL SPINE WITHOUT CONTRAST TECHNIQUE: Multidetector CT imaging of the head and cervical spine was performed following the standard protocol without intravenous contrast. Multiplanar CT image reconstructions of the cervical spine were also generated. COMPARISON:  MRI of the head February 01, 2016 and CT HEAD September 13, 2015  FINDINGS: CT HEAD FINDINGS BRAIN: Stable moderate to severe ventriculomegaly with disproportionate sulcal effacement at the convexities and narrowed callosal angle. Patchy supratentorial white matter hypodensities compatible with mild chronic small vessel ischemic disease. No acute large vascular territory infarcts. No midline shift, mass effect. Basal cisterns are patent. VASCULAR: Mild calcific atherosclerosis of the carotid siphons. SKULL: No skull fracture. No significant scalp soft tissue swelling. SINUSES/ORBITS: Mild paranasal sinus mucosal thickening without air-fluid levels. Mastoid air cells are well aerated. Soft tissue within LEFT external auditory canal compatible with cerumen.The included ocular globes and orbital contents are non-suspicious. OTHER: None. CT CERVICAL SPINE FINDINGS ALIGNMENT: Straightened lordosis.  Vertebral bodies in alignment. SKULL BASE AND VERTEBRAE: Cervical vertebral bodies and posterior elements are intact. Moderate to severe C5-6 and C6-7 disc height loss with endplate spurring compatible with degenerative  discs. Multilevel moderate to severe facet arthropathy. C1-2 articulation maintained with severe arthropathy. No destructive bony lesions. SOFT TISSUES AND SPINAL CANAL: Nonacute. Mild calcific atherosclerosis carotid bifurcations. 22 mm RIGHT thyroid nodule. DISC LEVELS: No significant osseous canal stenosis. Moderate to severe RIGHT C3-4, severe LEFT C4-5, severe RIGHT C5-6 and severe bilateral C6-7 neural foraminal narrowing. UPPER CHEST: Lung apices are clear. OTHER: None. IMPRESSION: CT HEAD: 1. No acute intracranial process. 2. 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 CT CERVICAL SPINE: 1. No acute fracture or malalignment. 2. Severe C4-5 through C6-7 neural foraminal narrowing. 3. ** An incidental finding of potential clinical significance has been found. 22 mm RIGHT thyroid nodule. This follows ACR consensus guidelines: Managing Incidental Thyroid Nodules Detected on Imaging: White Paper of the ACR Incidental Thyroid Findings Committee. J Am Coll Radiol 2015; 12:143-150.** Electronically Signed   By: Awilda Metro M.D.   On: 03/19/2018 04:09   Ct Cervical Spine Wo Contrast  Result Date: 03/19/2018 CLINICAL DATA:  Two unwitnessed falls. RIGHT periorbital laceration. On Eliquis. History of dementia and alcohol abuse. EXAM: CT HEAD WITHOUT CONTRAST CT CERVICAL SPINE WITHOUT CONTRAST TECHNIQUE: Multidetector CT imaging of the head and cervical spine was performed following the standard protocol without intravenous contrast. Multiplanar CT image reconstructions of the cervical spine were also generated. COMPARISON:  MRI of the head February 01, 2016 and CT HEAD September 13, 2015 FINDINGS: CT HEAD FINDINGS BRAIN: Stable moderate to severe ventriculomegaly with disproportionate sulcal effacement at the convexities and narrowed callosal angle. Patchy supratentorial white matter hypodensities compatible with mild chronic  small vessel ischemic disease. No acute large vascular territory infarcts. No midline shift, mass effect. Basal cisterns are patent. VASCULAR: Mild calcific atherosclerosis of the carotid siphons. SKULL: No skull fracture. No significant scalp soft tissue swelling. SINUSES/ORBITS: Mild paranasal sinus mucosal thickening without air-fluid levels. Mastoid air cells are well aerated. Soft tissue within LEFT external auditory canal compatible with cerumen.The included ocular globes and orbital contents are non-suspicious. OTHER: None. CT CERVICAL SPINE FINDINGS ALIGNMENT: Straightened lordosis.  Vertebral bodies in alignment. SKULL BASE AND VERTEBRAE: Cervical vertebral bodies and posterior elements are intact. Moderate to severe C5-6 and C6-7 disc height loss with endplate spurring compatible with degenerative discs. Multilevel moderate to severe facet arthropathy. C1-2 articulation maintained with severe arthropathy. No destructive bony lesions. SOFT TISSUES AND SPINAL CANAL: Nonacute. Mild calcific atherosclerosis carotid bifurcations. 22 mm RIGHT thyroid nodule. DISC LEVELS: No significant osseous canal stenosis. Moderate to severe RIGHT C3-4, severe LEFT C4-5, severe RIGHT C5-6 and severe bilateral C6-7 neural foraminal narrowing. UPPER CHEST: Lung apices are clear. OTHER: None. IMPRESSION: CT  HEAD: 1. No acute intracranial process. 2. 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 CT CERVICAL SPINE: 1. No acute fracture or malalignment. 2. Severe C4-5 through C6-7 neural foraminal narrowing. 3. ** An incidental finding of potential clinical significance has been found. 22 mm RIGHT thyroid nodule. This follows ACR consensus guidelines: Managing Incidental Thyroid Nodules Detected on Imaging: White Paper of the ACR Incidental Thyroid Findings Committee. J Am Coll Radiol 2015; 12:143-150.** Electronically Signed   By: Awilda Metro  M.D.   On: 03/19/2018 04:09    Procedures Procedures (including critical care time)  Medications Ordered in ED Medications - No data to display   Initial Impression / Assessment and Plan / ED Course  I have reviewed the triage vital signs and the nursing notes.  Pertinent labs & imaging results that were available during my care of the patient were reviewed by me and considered in my medical decision making (see chart for details).     Patient with generalized weakness, inability to walk, some cough and congestion noticed by the nurse.  This is not patient's baseline.  Was sent here for further evaluation.  Patient has a rectal temperature 100.1, suspicious for possible infectious process.  His heart rate, blood pressure, respiratory rate all normal.  He does have wheezing and rales on his lung exam, will get a chest x-ray, will get labs.  Will check urine analysis as well.  12:11 AM Labs unremarkable. Urine negative. cxr and repeat ct head negative. Discussed with Dr. Madilyn Hook whom she has seen. Pt ambulated in hall with shuffling gate. I spoke again with pt's skillned nursing facility, pt apparently walks with shuffling gate at baseline. In fact they contacted family member yesterday about getting him a walker. Today he has been slightly weaker than normal and fell twice. I do not see any findings on todays work up that would require admission to the hospital. Will dc to facility. We will see if we can setup PT at his facility and social work. He will need to follow up with family doctor.   Vitals:   03/19/18 2130 03/19/18 2145 03/19/18 2200 03/19/18 2215  BP: 123/71 112/64 (!) 152/89 (!) 121/93  Pulse: 90 75 93 87  Resp: 14 (!) 21 19 17   Temp:      TempSrc:      SpO2: 92% 92% 92% 93%     Final Clinical Impressions(s) / ED Diagnoses   Final diagnoses:  Weakness  Gait abnormality    ED Discharge Orders        Ordered    Walker standard     03/20/18 0018       Jaynie Crumble, PA-C 03/20/18 0019    Tilden Fossa, MD 03/23/18 1149

## 2018-03-19 NOTE — ED Notes (Signed)
Walked pt out of room and back, pt was steady with 2 assist. Pt back in bed resting

## 2018-03-19 NOTE — ED Notes (Signed)
Told pt we need some urine if he cant use the bathroom we will have to cath him. Pt understood and said he will try.

## 2018-03-19 NOTE — ED Provider Notes (Signed)
[facility] EMERGENCY DEPARTMENT Provider Note   CSN: [id number] Arrival date & time: 03/19/18  0304     History   Chief Complaint Chief Complaint  Patient presents with  . Fall    HPI Mark Mullen is a 83 y.o. male.  The history is provided by the EMS personnel. History limited by: level 5 dementia.  Fall  This is a new problem. The current episode started less than 1 hour ago. The problem occurs constantly. The problem has not changed since onset.Pertinent negatives include no chest pain, no abdominal pain, no headaches and no shortness of breath. Nothing aggravates the symptoms. Nothing relieves the symptoms. He has tried nothing for the symptoms. The treatment provided no relief.    Past Medical History:  Diagnosis Date  . Alcohol abuse   . Atrial fibrillation (HCC)   . CHF (congestive heart failure) (HCC)   . Dementia   . GERD (gastroesophageal reflux disease)   . History of hiatal hernia   . Hypertension     Patient Active Problem List   Diagnosis Date Noted  . Hypertension   . History of hiatal hernia   . GERD (gastroesophageal reflux disease)   . Dementia   . CHF (congestive heart failure) (HCC)   . Alcohol abuse   . Intractable nausea and vomiting 02/19/2016  . Diarrhea 02/19/2016  . Abnormal EKG 02/19/2016  . Chronic combined systolic and diastolic heart failure (HCC) 02/19/2016  . Mild dementia 01/19/2016  . Essential hypertension 01/19/2016  . Atrial fibrillation (HCC) 01/19/2016  . Cardiomyopathy - etiology not yet determined 10/08/2015  . RBBB 10/08/2015  . Anticoagulated 10/08/2015  . Hypertensive heart disease with CHF (congestive heart failure) (HCC) 09/26/2015  . Vitamin D deficiency 09/26/2015  . Yeast dermatitis 09/26/2015  . Atrial fibrillation, unspecified   . Alcohol withdrawal (HCC)   . Malnutrition of moderate degree (HCC) 09/14/2015  . Severe sepsis with acute organ dysfunction (HCC) 09/13/2015  . PAF (paroxysmal  atrial fibrillation) (HCC) 09/13/2015  . NSTEMI (non-ST elevated myocardial infarction) (HCC) 09/13/2015  . Acute combined systolic and diastolic congestive heart failure (HCC) 09/13/2015  . Acute respiratory failure with hypoxia and hypercapnia (HCC) 09/13/2015    Past Surgical History:  Procedure Laterality Date  . EYE SURGERY Right   . TONSILLECTOMY          Home Medications    Prior to Admission medications   Medication Sig Start Date End Date Taking? Authorizing Provider  acyclovir (ZOVIRAX) 400 MG tablet Take 400 mg by mouth daily.     [provider]  Ascorbic Acid (VITAMIN C PO) Take 500 mg by mouth daily.     [provider]  Cholecalciferol (VITAMIN D PO) Take 1,000 Units by mouth daily.     [provider]  Docosanol (ABREVA) 10 % CREA Apply 1 application topically as directed.    [provider]  donepezil (ARICEPT) 10 MG tablet Take 10 mg by mouth at bedtime.    [provider]  ELIQUIS 2.5 MG TABS tablet  01/16/18   [provider]  hydrALAZINE (APRESOLINE) 10 MG tablet Take 1 tablet (10 mg total) by mouth every 8 (eight) hours. 02/25/16   Hollice Espy, MD  lisinopril (PRINIVIL,ZESTRIL) 20 MG tablet Take 20 mg by mouth 2 (two) times daily.    [provider]  methimazole (TAPAZOLE) 5 MG tablet Take 5 mg by mouth daily.  12/10/17   [provider]    Family  History Family History  Problem Relation Age of Onset  . Brain cancer Mother   . Aneurysm Father   . Diabetes Sister   . Diabetes Brother     Social History Social History   Tobacco Use  . Smoking status: Former Smoker    Packs/day: 1.00    Years: 53.00    Pack years: 53.00    Types: Cigarettes    Last attempt to quit: 09/13/2015    Years since quitting: 2.5  . Smokeless tobacco: Never Used  Substance Use Topics  . Alcohol use: Yes    Alcohol/week: 0.0 oz    Comment: 02/24/2016 "stopped 3 cocktails/day ~ 08/2015"  . Drug use:  No     Allergies   Patient has no known allergies.   Review of Systems Review of Systems  Unable to perform ROS: Dementia  Respiratory: Negative for shortness of breath.   Cardiovascular: Negative for chest pain.  Gastrointestinal: Negative for abdominal pain.  Skin: Negative for rash.  Neurological: Negative for headaches.     Physical Exam Updated Vital Signs BP 138/84 (BP Location: Right Arm)   Pulse (!) 103   Temp 97.8 F (36.6 C) (Oral)   Resp 15   Ht 5\' 9"  (1.753 m)   Wt 76.2 kg (168 lb)   SpO2 97%   BMI 24.81 kg/m   Physical Exam  Constitutional: He is oriented to person, place, and time. He appears well-developed and well-nourished. No distress.  HENT:  Head: Normocephalic and atraumatic.  Right Ear: External ear normal.  Left Ear: External ear normal.  Nose: Nose normal.  Mouth/Throat: Oropharynx is clear and moist. No oropharyngeal exudate.  Eyes: Pupils are equal, round, and reactive to light. Conjunctivae are normal.  Neck: Normal range of motion. Neck supple.  Cardiovascular: Normal rate, regular rhythm, normal heart sounds and intact distal pulses.  Pulmonary/Chest: Effort normal and breath sounds normal. No stridor. He has no wheezes. He has no rales.  Abdominal: Soft. Bowel sounds are normal. He exhibits no mass. There is no tenderness. There is no rebound and no guarding.  Musculoskeletal: Normal range of motion.  Neurological: He is alert and oriented to person, place, and time. He displays normal reflexes.  Skin: Skin is warm and dry. Capillary refill takes less than 2 seconds.  Psychiatric: He has a normal mood and affect.     ED Treatments / Results  Labs (all labs ordered are listed, but only abnormal results are displayed) Labs Reviewed - No data to display  EKG None  Radiology Ct Head Wo Contrast  Result Date: 03/19/2018 CLINICAL DATA:  Two unwitnessed falls. RIGHT periorbital laceration. On Eliquis. History of dementia and alcohol  abuse. EXAM: CT HEAD WITHOUT CONTRAST CT CERVICAL SPINE WITHOUT CONTRAST TECHNIQUE: Multidetector CT imaging of the head and cervical spine was performed following the standard protocol without intravenous contrast. Multiplanar CT image reconstructions of the cervical spine were also generated. COMPARISON:  MRI of the head February 01, 2016 and CT HEAD September 13, 2015 FINDINGS: CT HEAD FINDINGS BRAIN: Stable moderate to severe ventriculomegaly with disproportionate sulcal effacement at the convexities and narrowed callosal angle. Patchy supratentorial white matter hypodensities compatible with mild chronic small vessel ischemic disease. No acute large vascular territory infarcts. No midline shift, mass effect. Basal cisterns are patent. VASCULAR: Mild calcific atherosclerosis of the carotid siphons. SKULL: No skull fracture. No significant scalp soft tissue swelling. SINUSES/ORBITS: Mild paranasal sinus mucosal thickening without air-fluid levels. Mastoid air cells are well aerated.  Soft tissue within LEFT external auditory canal compatible with cerumen.The included ocular globes and orbital contents are non-suspicious. OTHER: None. CT CERVICAL SPINE FINDINGS ALIGNMENT: Straightened lordosis.  Vertebral bodies in alignment. SKULL BASE AND VERTEBRAE: Cervical vertebral bodies and posterior elements are intact. Moderate to severe C5-6 and C6-7 disc height loss with endplate spurring compatible with degenerative discs. Multilevel moderate to severe facet arthropathy. C1-2 articulation maintained with severe arthropathy. No destructive bony lesions. SOFT TISSUES AND SPINAL CANAL: Nonacute. Mild calcific atherosclerosis carotid bifurcations. 22 mm RIGHT thyroid nodule. DISC LEVELS: No significant osseous canal stenosis. Moderate to severe RIGHT C3-4, severe LEFT C4-5, severe RIGHT C5-6 and severe bilateral C6-7 neural foraminal narrowing. UPPER CHEST: Lung apices are clear. OTHER: None. IMPRESSION: CT HEAD: 1. No  acute intracranial process. 2. 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 CT CERVICAL SPINE: 1. No acute fracture or malalignment. 2. Severe C4-5 through C6-7 neural foraminal narrowing. 3. ** An incidental finding of potential clinical significance has been found. 22 mm RIGHT thyroid nodule. This follows ACR consensus guidelines: Managing Incidental Thyroid Nodules Detected on Imaging: White Paper of the ACR Incidental Thyroid Findings Committee. J Am Coll Radiol 2015; 12:143-150.** Electronically Signed   By: Awilda Metro M.D.   On: 03/19/2018 04:09   Ct Cervical Spine Wo Contrast  Result Date: 03/19/2018 CLINICAL DATA:  Two unwitnessed falls. RIGHT periorbital laceration. On Eliquis. History of dementia and alcohol abuse. EXAM: CT HEAD WITHOUT CONTRAST CT CERVICAL SPINE WITHOUT CONTRAST TECHNIQUE: Multidetector CT imaging of the head and cervical spine was performed following the standard protocol without intravenous contrast. Multiplanar CT image reconstructions of the cervical spine were also generated. COMPARISON:  MRI of the head February 01, 2016 and CT HEAD September 13, 2015 FINDINGS: CT HEAD FINDINGS BRAIN: Stable moderate to severe ventriculomegaly with disproportionate sulcal effacement at the convexities and narrowed callosal angle. Patchy supratentorial white matter hypodensities compatible with mild chronic small vessel ischemic disease. No acute large vascular territory infarcts. No midline shift, mass effect. Basal cisterns are patent. VASCULAR: Mild calcific atherosclerosis of the carotid siphons. SKULL: No skull fracture. No significant scalp soft tissue swelling. SINUSES/ORBITS: Mild paranasal sinus mucosal thickening without air-fluid levels. Mastoid air cells are well aerated. Soft tissue within LEFT external auditory canal compatible with cerumen.The included ocular globes and orbital contents are  non-suspicious. OTHER: None. CT CERVICAL SPINE FINDINGS ALIGNMENT: Straightened lordosis.  Vertebral bodies in alignment. SKULL BASE AND VERTEBRAE: Cervical vertebral bodies and posterior elements are intact. Moderate to severe C5-6 and C6-7 disc height loss with endplate spurring compatible with degenerative discs. Multilevel moderate to severe facet arthropathy. C1-2 articulation maintained with severe arthropathy. No destructive bony lesions. SOFT TISSUES AND SPINAL CANAL: Nonacute. Mild calcific atherosclerosis carotid bifurcations. 22 mm RIGHT thyroid nodule. DISC LEVELS: No significant osseous canal stenosis. Moderate to severe RIGHT C3-4, severe LEFT C4-5, severe RIGHT C5-6 and severe bilateral C6-7 neural foraminal narrowing. UPPER CHEST: Lung apices are clear. OTHER: None. IMPRESSION: CT HEAD: 1. No acute intracranial process. 2. 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 CT CERVICAL SPINE: 1. No acute fracture or malalignment. 2. Severe C4-5 through C6-7 neural foraminal narrowing. 3. ** An incidental finding of potential clinical significance has been found. 22 mm RIGHT thyroid nodule. This follows ACR consensus guidelines: Managing Incidental Thyroid Nodules Detected on Imaging: White Paper of the ACR Incidental Thyroid Findings Committee. J Am Coll Radiol 2015;  12:143-150.** Electronically Signed   By: Awilda Metro M.D.   On: 03/19/2018 04:09    Procedures .Marland KitchenLaceration Repair Date/Time: 03/19/2018 5:11 AM Performed by: Cy Blamer, MD Authorized by: Cy Blamer, MD   Consent:    Consent obtained:  Verbal   Consent given by:  Patient   Risks discussed:  Infection, need for additional repair, nerve damage, poor wound healing, poor cosmetic result, pain, retained foreign body, tendon damage and vascular damage   Alternatives discussed:  No treatment Anesthesia (see MAR for exact dosages):     Anesthesia method:  Topical application   Topical anesthetic:  LET Laceration details:    Location:  Scalp   Scalp location:  R temporal   Length (cm):  1.5   Depth (mm):  1 Repair type:    Repair type:  Simple Pre-procedure details:    Preparation:  Patient was prepped and draped in usual sterile fashion and imaging obtained to evaluate for foreign bodies Exploration:    Hemostasis achieved with:  Direct pressure   Wound extent: no areolar tissue violation noted     Contaminated: no   Treatment:    Area cleansed with:  Shur-Clens   Amount of cleaning:  Standard Skin repair:    Repair method:  Staples   Number of staples:  3 Approximation:    Approximation:  Close Post-procedure details:    Dressing:  Antibiotic ointment and sterile dressing   Patient tolerance of procedure:  Tolerated well, no immediate complications   (including critical care time)   Final Clinical Impressions(s) / ED Diagnoses   Return for weakness, numbness, changes in vision or speech, fevers >100.4 unrelieved by medication, shortness of breath, intractable vomiting, or diarrhea, abdominal pain, Inability to tolerate liquids or food, cough, altered mental status or any concerns. No signs of systemic illness or infection. The patient is nontoxic-appearing on exam and vital signs are within normal limits.   I have reviewed the triage vital signs and the nursing notes. Pertinent labs &imaging results that were available during my care of the patient were reviewed by me and considered in my medical decision making (see chart for details).  After history, exam, and medical workup I feel the patient has been appropriately medically screened and is safe for discharge home. Pertinent diagnoses were discussed with the patient. Patient was given return precautions.    Nadira Single, MD 03/19/18 [phone]

## 2018-03-19 NOTE — ED Triage Notes (Addendum)
Per EMS, pt is from [facility] and had two un witnessed falls that the pt has no memory of. During the pt's second fall he obtained a laceration above his right eye and two lacerations on his right hand. The pt is on eliquis. Pt has no prior hx of falls but does have dementia. Pt denies head or neck pain and denies any vision loss. Pt is currently in afib. BP 140/80.

## 2018-03-19 NOTE — ED Notes (Signed)
Patient transported to CT 

## 2018-03-19 NOTE — ED Notes (Signed)
Patient verbalizes understanding of discharge instructions. Opportunity for questioning and answers were provided. Armband removed by staff, pt to be discharged from ED when [facility] arrives. Due to dementia pt too confused to sign E signature.

## 2018-03-19 NOTE — ED Notes (Signed)
ED provider at bedside to staple pts laceration. Bandages applied.

## 2018-03-19 NOTE — ED Notes (Signed)
LET, dressing, bandages applied to laceration above right eye.

## 2018-03-19 NOTE — ED Notes (Signed)
Contacted [facility] to let staff know pt back on their way

## 2018-03-20 LAB — URINE CULTURE: Culture: NO GROWTH

## 2018-03-20 NOTE — Discharge Instructions (Signed)
Todays work up showed no abnormalities that would explain pt's weakness. Follow up with family doctor. We have asked to see if PT and OT can be set up for patient at facility. Patient may benefit from a walker use.

## 2018-05-17 ENCOUNTER — Ambulatory Visit: Payer: Medicare Other | Admitting: Cardiology

## 2018-05-21 NOTE — Progress Notes (Signed)
Cardiology Office Note   Date:  05/23/2018   ID:  Mark Mullen, DOB 1934/12/25, MRN [id number]  PCP:  Florentina Jenny, MD  Cardiologist:   Rollene Rotunda, MD   Chief Complaint  Patient presents with  . Atrial Fibrillation      History of Present Illness: Mark Mullen is a 83 y.o. male who presents for evaluation of cardiomyopathy. He was in the hospital in 2016 with pneumonia and sepsis. He had atrial fibrillation with a rapid rate. He did have elevated cardiac enzymes. He was also found to have a cardiomyopathy with an EF of about 25% with diffuse hypokinesis. Follow up stress perfusion study demonstrated a fixed inferior and inferoapical perfusion defect suggestive of scar. He was managed medically.  Follow up echo in 2017 demonstrated that the EF had returned to normal.  He was in atrial flutter when I saw him recently.  A Holter demonstrated atrial fib/flutter with controlled rate.    He gets around very slowly with a walker.  The patient denies any new symptoms such as chest discomfort, neck or arm discomfort. There has been no new shortness of breath, PND or orthopnea. There have been no reported palpitations, presyncope or syncope.      Past Medical History:  Diagnosis Date  . Alcohol abuse   . Atrial fibrillation (HCC)   . CHF (congestive heart failure) (HCC)   . Dementia   . GERD (gastroesophageal reflux disease)   . History of hiatal hernia   . Hypertension     Past Surgical History:  Procedure Laterality Date  . EYE SURGERY Right   . TONSILLECTOMY       Current Outpatient Medications  Medication Sig Dispense Refill  . acetaminophen (TYLENOL) 500 MG tablet Take 500 mg by mouth every 8 (eight) hours as needed (for pain).    Marland Kitchen acyclovir (ZOVIRAX) 400 MG tablet Take 400 mg by mouth daily.     . Cholecalciferol (VITAMIN D-3) 1000 units CAPS Take 1,000 Units by mouth daily.    . Docosanol (ABREVA) 10 % CREA Apply 1 application topically See admin instructions. Apply  to groin-area lesions topically every 6 hours as needed for other herpes/viral infection    . donepezil (ARICEPT) 10 MG tablet Take 10 mg by mouth daily.     Marland Kitchen ELIQUIS 2.5 MG TABS tablet Take 2.5 mg by mouth 2 (two) times daily.     . hydrALAZINE (APRESOLINE) 10 MG tablet Take 1 tablet (10 mg total) by mouth every 8 (eight) hours. 90 tablet 1  . lisinopril (PRINIVIL,ZESTRIL) 20 MG tablet Take 20 mg by mouth daily.     . methimazole (TAPAZOLE) 5 MG tablet Take 5 mg by mouth daily.     . polyethylene glycol powder (GLYCOLAX/MIRALAX) powder Take 17 g by mouth daily as needed (for constipation).    . polyvinyl alcohol (LIQUIFILM TEARS) 1.4 % ophthalmic solution Place 1 drop into both eyes every 12 (twelve) hours as needed for dry eyes.    . vitamin C (ASCORBIC ACID) 500 MG tablet Take 500 mg by mouth daily.     No current facility-administered medications for this visit.     Allergies:   Patient has no known allergies.    ROS:  Please see the history of present illness.   Otherwise, review of systems are positive for none.   All other systems are reviewed and negative.    PHYSICAL EXAM: VS:  BP 130/80   Pulse 92   Ht 5'  9" (1.753 m)   Wt 161 lb 6.4 oz (73.2 kg)   BMI 23.83 kg/m  , BMI Body mass index is 23.83 kg/m.  GENERAL:  Frail appearing NECK:  No jugular venous distention, waveform within normal limits, carotid upstroke brisk and symmetric, no bruits, no thyromegaly LUNGS:  Clear to auscultation bilaterally CHEST:  Unremarkable HEART:  PMI not displaced or sustained,S1 and S2 within normal limits, no S3,  no clicks, no rubs, no murmurs, irregular  ABD:  Flat, positive bowel sounds normal in frequency in pitch, no bruits, no rebound, no guarding, no midline pulsatile mass, no hepatomegaly, no splenomegaly EXT:  2 plus pulses throughout, no edema, no cyanosis no clubbing   EKG:  EKG is   ordered today. Atrial fibrillation, right bundle branch block, left ventricular hypertrophy by  voltage criteria, no acute ST-T wave changes.  Recent Labs: 03/19/2018: ALT 25; BUN 25; Creatinine, Ser 1.63; Hemoglobin 16.2; Platelets 184; Potassium 3.9; Sodium 139    Lipid Panel No results found for: CHOL, TRIG, HDL, CHOLHDL, VLDL, LDLCALC, LDLDIRECT    Wt Readings from Last 3 Encounters:  05/23/18 161 lb 6.4 oz (73.2 kg)  03/19/18 168 lb (76.2 kg)  02/05/18 168 lb 9.6 oz (76.5 kg)      Other studies Reviewed: Additional studies/ records that were reviewed today include: Labs Review of the above records demonstrates:    ASSESSMENT AND PLAN:  Cardiomyopathy -  The patient's ejection fraction improved.  No further imaging is planned.   PAF (paroxysmal atrial fibrillation) (HCC)/flutter CHADS VASc=3 .   He has had good rate control.    The patient  tolerates this rhythm and rate control and anticoagulation. We will continue with the meds as listed.  He is up to date with blood work and on the correct dose of DOAC.  No change in therapy.   HTN The blood pressure is at target.  No change in therapy.   CKD III:  Creat was elevated but stable.  No change in therapy.     Current medicines are reviewed at length with the patient today.  The patient does not have concerns regarding medicines.  The following changes have been made:  None  Labs/ tests ordered today include: None  No orders of the defined types were placed in this encounter.    Disposition:   FU with me 12 months.  Signed, Rollene Rotunda, MD  05/23/2018 10:44 AM    [facility]

## 2018-05-23 ENCOUNTER — Encounter: Payer: Self-pay | Admitting: Cardiology

## 2018-05-23 ENCOUNTER — Ambulatory Visit (INDEPENDENT_AMBULATORY_CARE_PROVIDER_SITE_OTHER): Payer: Medicare Other | Admitting: Cardiology

## 2018-05-23 VITALS — BP 130/80 | HR 92 | Ht 69.0 in | Wt 161.4 lb

## 2018-05-23 DIAGNOSIS — I10 Essential (primary) hypertension: Secondary | ICD-10-CM | POA: Diagnosis not present

## 2018-05-23 DIAGNOSIS — I482 Chronic atrial fibrillation, unspecified: Secondary | ICD-10-CM

## 2018-05-23 NOTE — Patient Instructions (Signed)
Medication Instructions:  Continue current medications  If you need a refill on your cardiac medications before your next appointment, please call your pharmacy.  Labwork: None Ordered   Testing/Procedures: None Ordered  Follow-Up: Your physician wants you to follow-up in: 1 Year. You should receive a reminder letter in the mail two months in advance. If you do not receive a letter, please call our office [phone].    Thank you for choosing [facility]!!      

## 2018-06-04 NOTE — Addendum Note (Signed)
Addended by: Evans Lance on: 06/04/2018 01:41 PM   Modules accepted: Orders

## 2018-12-31 ENCOUNTER — Ambulatory Visit: Payer: Medicare Other | Admitting: Neurology

## 2019-01-02 ENCOUNTER — Encounter: Payer: Self-pay | Admitting: Neurology

## 2019-01-02 DIAGNOSIS — Z029 Encounter for administrative examinations, unspecified: Secondary | ICD-10-CM

## 2019-06-11 IMAGING — DX DG CHEST 2V
2 series · 2 of 2 positions shown · non-contrast
Comparison: 02/19/2016

CLINICAL DATA: Altered mental status with recent falls

EXAM:
CHEST - 2 VIEW

[chest pa]
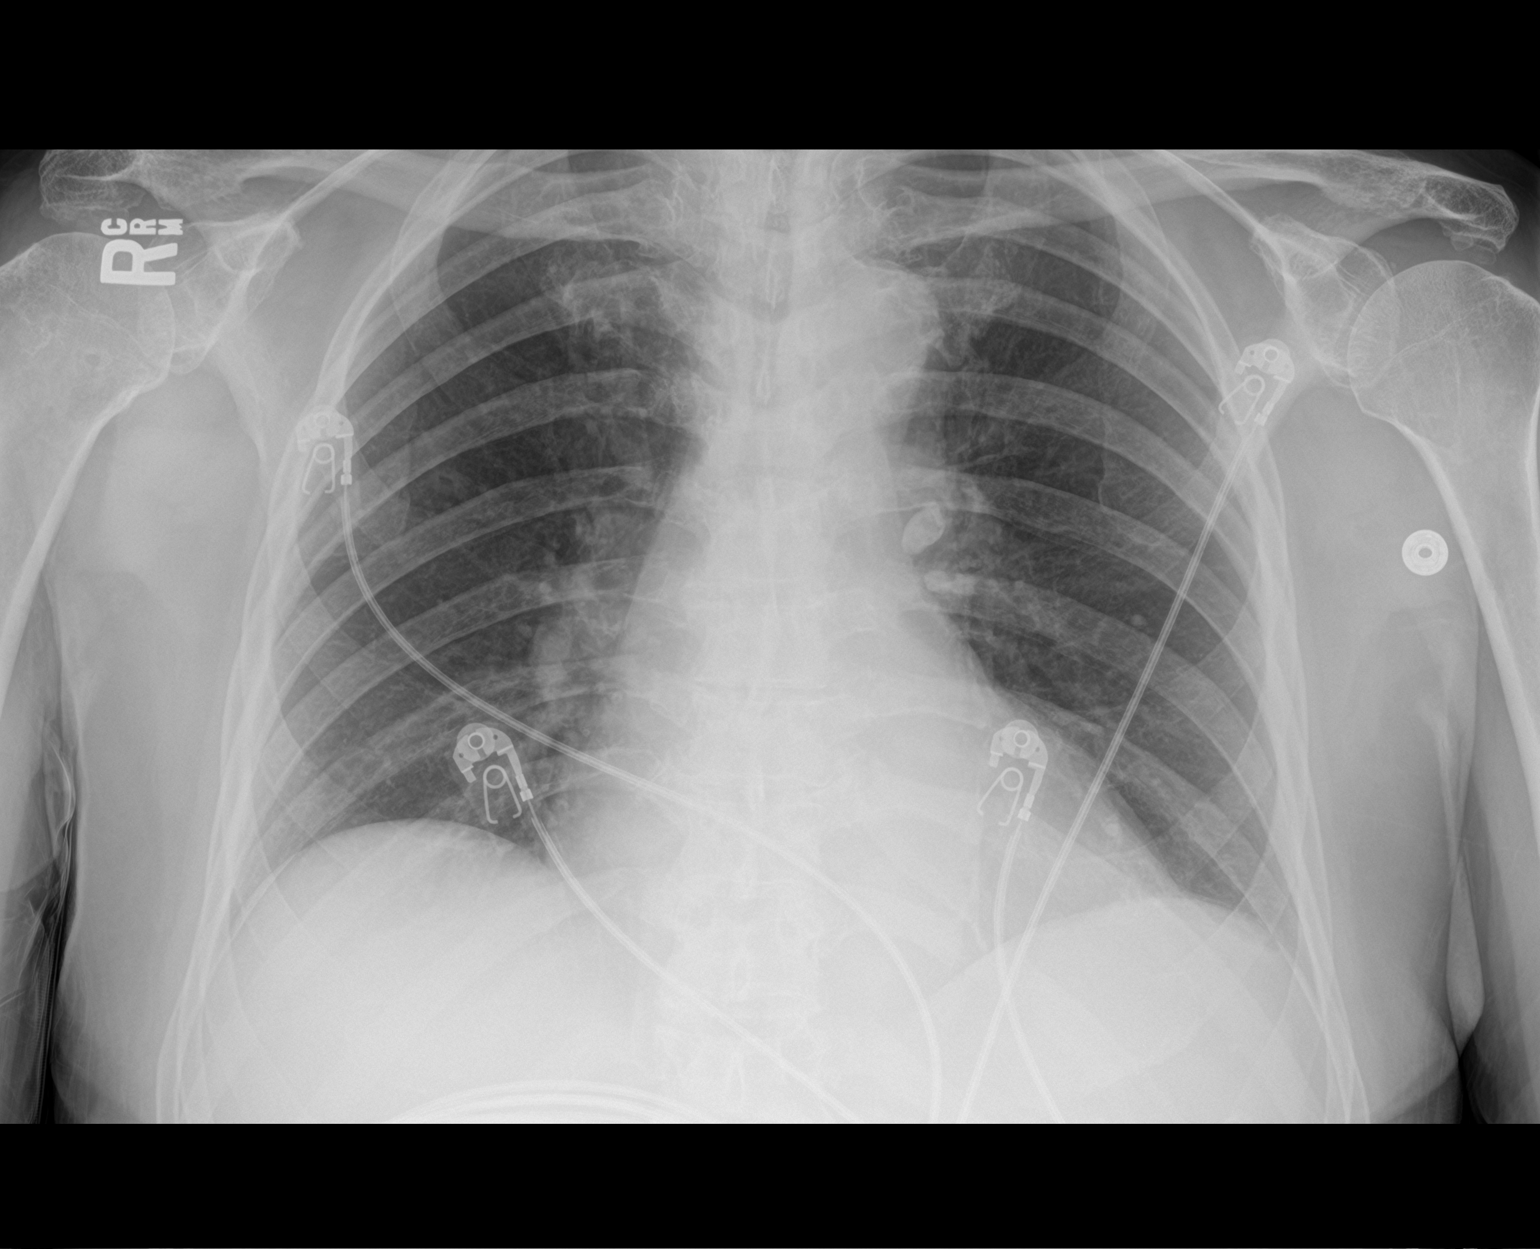

[chest lat]
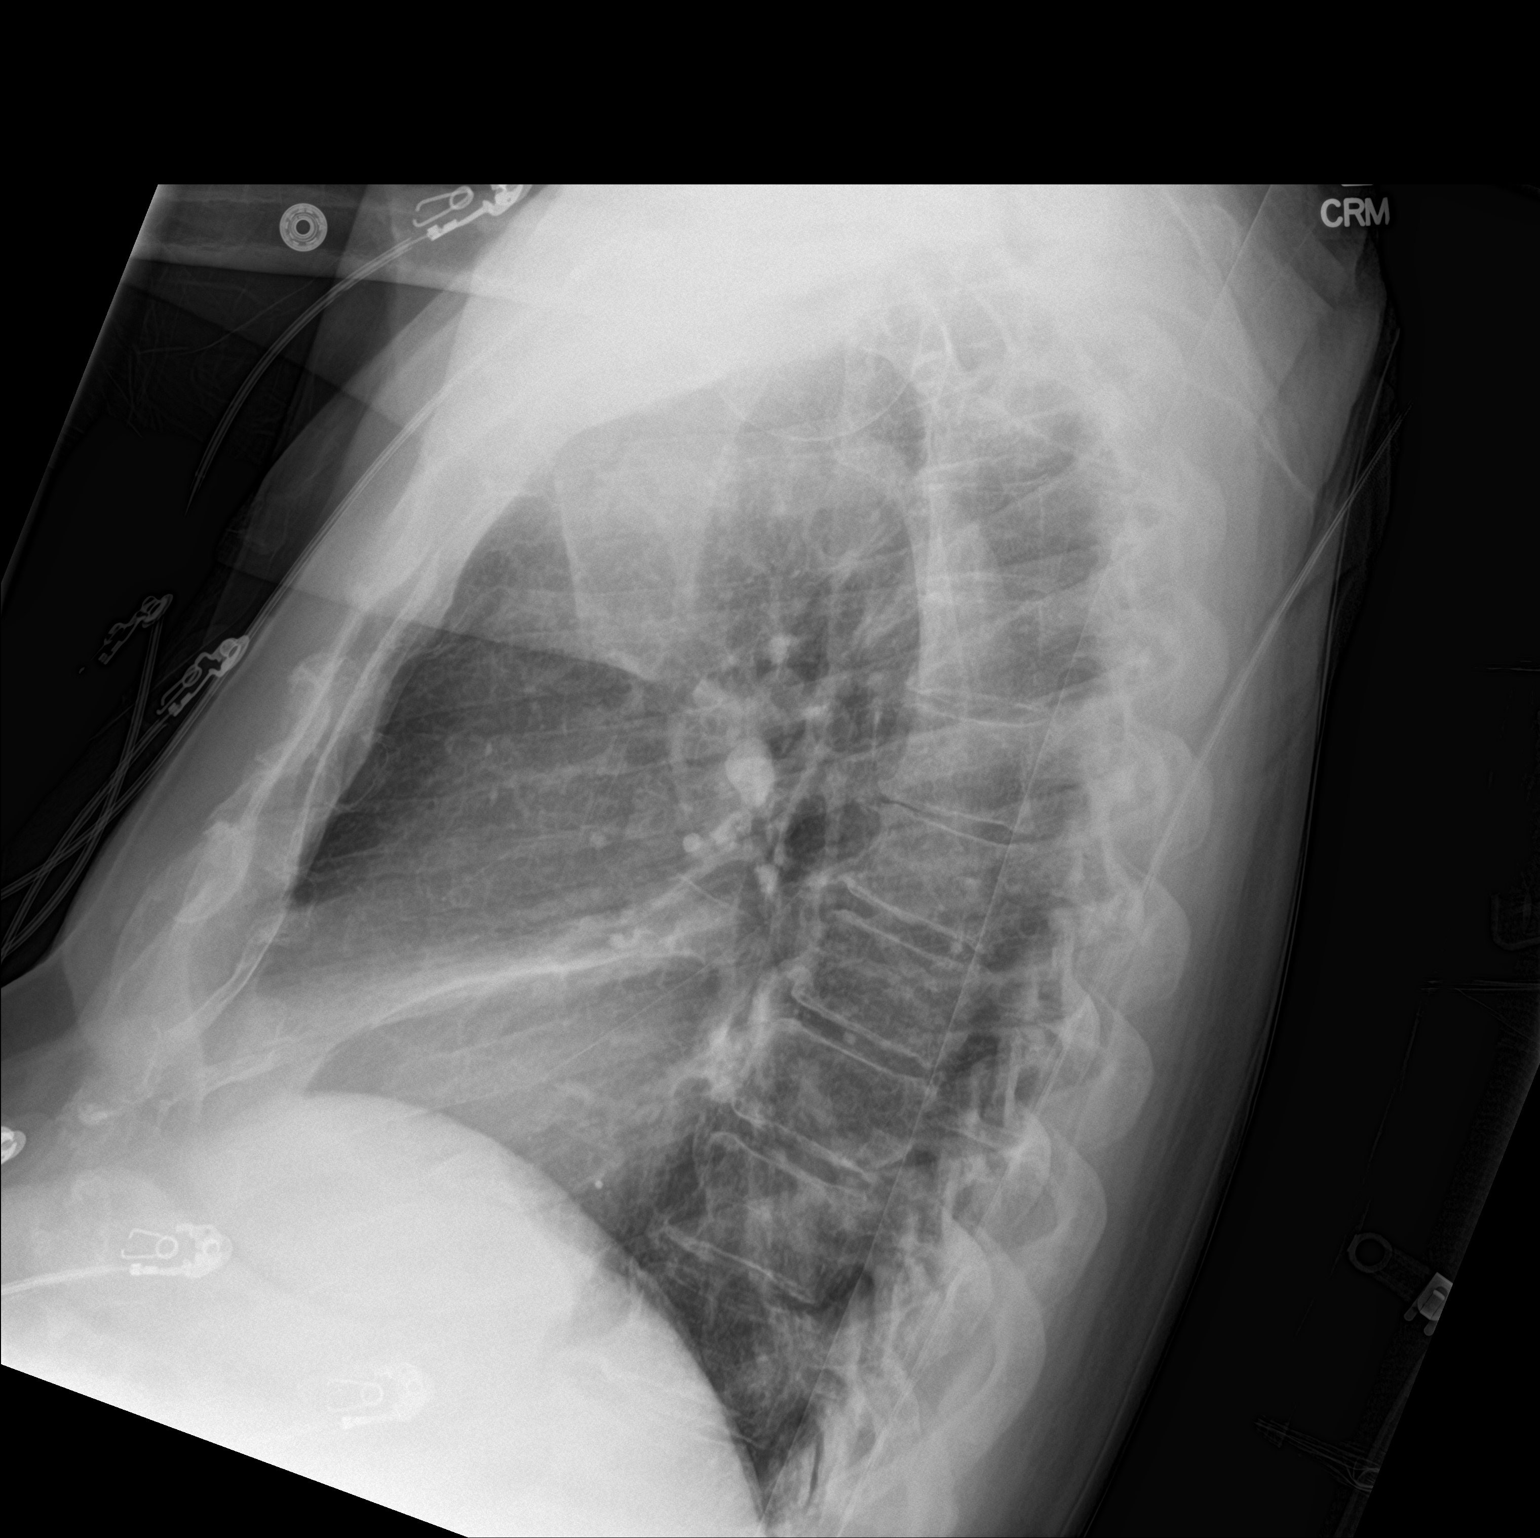

[2 of 2 positions shown; findings below may reference images not displayed]

FINDINGS: Cardiac shadow is within normal limits. Changes of prior
granulomatous disease are noted and stable. The lungs are well
aerated. Linear density is noted on the lateral projection in the
region of the right middle lobe. This may represent scarring or
focal atelectasis. No sizable effusion is seen. No bony abnormality
is noted.
IMPRESSION: Linear density in the right middle lobe best seen on the lateral
projection likely representing atelectasis or scarring.

## 2019-06-18 DEATH — deceased
